# Patient Record
Sex: Male | Born: 1955
Health system: Southern US, Community
[De-identification: ages and names within clinical notes are randomized; demographics above are authoritative.]

## PROBLEM LIST (undated history)

## (undated) DIAGNOSIS — E785 Hyperlipidemia, unspecified: Secondary | ICD-10-CM

## (undated) DIAGNOSIS — E876 Hypokalemia: Secondary | ICD-10-CM

## (undated) DIAGNOSIS — J309 Allergic rhinitis, unspecified: Secondary | ICD-10-CM

## (undated) DIAGNOSIS — K625 Hemorrhage of anus and rectum: Secondary | ICD-10-CM

## (undated) DIAGNOSIS — E269 Hyperaldosteronism, unspecified: Secondary | ICD-10-CM

## (undated) DIAGNOSIS — R739 Hyperglycemia, unspecified: Secondary | ICD-10-CM

## (undated) DIAGNOSIS — I10 Essential (primary) hypertension: Secondary | ICD-10-CM

## (undated) DIAGNOSIS — E041 Nontoxic single thyroid nodule: Secondary | ICD-10-CM

## (undated) HISTORY — DX: Essential (primary) hypertension: I10

## (undated) HISTORY — DX: Allergic rhinitis, unspecified: J30.9

## (undated) HISTORY — DX: Hyperaldosteronism, unspecified: E26.9

## (undated) HISTORY — DX: Hyperlipidemia, unspecified: E78.5

---

## 1999-02-15 DIAGNOSIS — K625 Hemorrhage of anus and rectum: Secondary | ICD-10-CM

## 1999-02-15 HISTORY — DX: Hemorrhage of anus and rectum: K62.5

## 1999-02-15 HISTORY — PX: ESOPHAGOGASTRODUODENOSCOPY: SHX1529

## 1999-02-15 HISTORY — PX: COLONOSCOPY: SHX174

## 1999-02-15 HISTORY — PX: SMALL BOWEL ENTEROSCOPY: SHX2415

## 1999-09-24 ENCOUNTER — Ambulatory Visit (HOSPITAL_COMMUNITY): Admission: RE | Admit: 1999-09-24 | Discharge: 1999-09-24 | Payer: Self-pay | Admitting: *Deleted

## 1999-09-24 ENCOUNTER — Encounter (INDEPENDENT_AMBULATORY_CARE_PROVIDER_SITE_OTHER): Payer: Self-pay | Admitting: Specialist

## 1999-12-10 ENCOUNTER — Encounter (INDEPENDENT_AMBULATORY_CARE_PROVIDER_SITE_OTHER): Payer: Self-pay | Admitting: Specialist

## 1999-12-11 ENCOUNTER — Inpatient Hospital Stay (HOSPITAL_COMMUNITY): Admission: EM | Admit: 1999-12-11 | Discharge: 1999-12-21 | Payer: Self-pay | Admitting: Emergency Medicine

## 1999-12-13 ENCOUNTER — Encounter: Payer: Self-pay | Admitting: *Deleted

## 1999-12-15 ENCOUNTER — Encounter: Payer: Self-pay | Admitting: General Surgery

## 1999-12-16 ENCOUNTER — Encounter: Payer: Self-pay | Admitting: General Surgery

## 1999-12-16 ENCOUNTER — Encounter: Payer: Self-pay | Admitting: *Deleted

## 1999-12-17 ENCOUNTER — Encounter: Payer: Self-pay | Admitting: General Surgery

## 2002-12-09 ENCOUNTER — Encounter: Payer: Self-pay | Admitting: Family Medicine

## 2002-12-09 ENCOUNTER — Encounter: Admission: RE | Admit: 2002-12-09 | Discharge: 2002-12-09 | Payer: Self-pay | Admitting: Family Medicine

## 2002-12-16 ENCOUNTER — Encounter: Admission: RE | Admit: 2002-12-16 | Discharge: 2002-12-16 | Payer: Self-pay | Admitting: Family Medicine

## 2003-07-31 ENCOUNTER — Encounter: Admission: RE | Admit: 2003-07-31 | Discharge: 2003-07-31 | Payer: Self-pay | Admitting: Family Medicine

## 2003-10-03 ENCOUNTER — Encounter: Admission: RE | Admit: 2003-10-03 | Discharge: 2003-10-03 | Payer: Self-pay | Admitting: Family Medicine

## 2004-10-18 ENCOUNTER — Emergency Department (HOSPITAL_COMMUNITY): Admission: EM | Admit: 2004-10-18 | Discharge: 2004-10-19 | Payer: Self-pay | Admitting: Emergency Medicine

## 2004-11-16 ENCOUNTER — Encounter: Admission: RE | Admit: 2004-11-16 | Discharge: 2004-12-09 | Payer: Self-pay | Admitting: Family Medicine

## 2006-10-11 ENCOUNTER — Emergency Department (HOSPITAL_COMMUNITY): Admission: EM | Admit: 2006-10-11 | Discharge: 2006-10-11 | Payer: Self-pay | Admitting: Emergency Medicine

## 2008-12-03 ENCOUNTER — Emergency Department (HOSPITAL_COMMUNITY): Admission: EM | Admit: 2008-12-03 | Discharge: 2008-12-03 | Payer: Self-pay | Admitting: Emergency Medicine

## 2010-07-02 NOTE — H&P (Signed)
[facility]  Patient:    Larry [name], Larry [name]                         MRN: [id number] Adm. Date:  [id number] Attending:  Sabino Gasser CC:         Redmond Baseman, M.D.  [employer]   History and Physical  HISTORY OF PRESENT ILLNESS:  The patient is a very pleasant 55 year old gentleman who I have seen once before in my office and have, in fact, done colonoscopy and endoscopy in August 2001.  The patient presented to the emergency room with hypotension and passage of bright red and dark red per rectum starting at approximately 4:30 this afternoon.  The patient and his wife give a history of approximately six bloody bowel movements since that time.  The patient denies abdominal pain or vomiting.  He did have shrimp and wine for dinner just prior to the onset of his symptoms.  The patient states that he had been previously well since I had seen him last in August, when he underwent endoscopy and colonoscopy.  He had been referred originally by Dr. Modesto Charon because of rectal bleeding, and I will refer to those findings from August 10.  Colonoscopy showed some erythematous changes in the rectum, which appeared endoscopically to be artifactual and, in fact, biopsies proved to be normal.  He also had internal hemorrhoids and, otherwise, had a negative colonoscopic examination to the terminal ileum.  Endoscopy, however, showed some changes of esophagitis and ulcers of the antrum which, at that time, I felt were probably the cause of the patients GI blood loss.  Biopsy at that time showed H. pylori gastritis around the ulcer base, and Prevpac was prescribed.  However, unfortunately, the patient never filled this prescription because his insurance company refused payment for the Prevpac because of the cost of $302, and the patient did not follow up with me further or call me to indicate that he was not under treatment, and was doing reasonably well until he  presented today with the bleeding and hypotension. The patient denied abdominal pain or vomiting.  He denies NASAIDs of any type. His wife says he takes absolutely no medication except for his hypertensive medication, Metoprolol 50 mg q.d. recently.  The patient does not smoke.  He occasionally uses alcohol, but in moderate quantities.  He lives with his wife, and they have been under some stress building a house in the [place] area of [place].  PHYSICAL EXAMINATION:  VITAL SIGNS:  Blood pressure 110 systolic with pulse 100.  His blood pressure on admission was 70 systolic.  He has been subsequently given IV fluids.  GENERAL:  Alert, oriented, pleasant man who appears in no distress, but is in the Trendelenburg position in the emergency room.  HEENT:  Otherwise unremarkable.  NECK:  No bruits are heard.  Thyroid is not enlarged.  LUNGS:  Clear.  HEART:  Regular rhythm without murmurs, rubs, or gallops.  ABDOMEN:  Obese but nontender throughout.  RECTAL:  No masses.  A small amount of fresh, bright red blood is noted per rectum.  LABORATORY DATA:  Hematocrit 38.1, protime 13.2 seconds.  No other laboratory studies draws at this time.  IMPRESSION:  Probable upper gastrointestinal bleed, recurrent, probably from the antral ulcers that have been untreated since August 2001, see above for details.  PLAN:  Admit the patient for observation.  Will plan endoscopy early in the morning (it is  currently after midnight).  Will probably need to treat this patient with proton pump inhibitor and treatment directed towards H. pylori. The patient denies any allergies at this time. DD:  12/11/99 TD:  12/11/99 Job: [id number]

## 2010-07-02 NOTE — Procedures (Signed)
[facility]  Patient:    Larry [name], Larry [name]                         MRN: [id number] Proc. Date: 12/13/99 Adm. Date:  [id number] Attending:  Sabino Gasser CC:         Redmond Baseman, M.D.   Procedure Report  PROCEDURE:  Colonoscopy.  GASTROENTEROLOGIST:  Sabino Gasser, M.D.  INDICATIONS:  Acute GI bleed.  Mr. Bonfanti presented to the hospital with hypotension, GI bleed.  Hematocrit dropped from 37 to 31; therefore, despite having a previous colonoscopy, we elected to prep him for another colon.  He had stopped bleeding but with the prep, he started bleeding once again.  ANESTHESIA:  Demerol 100 mg, Versed 12 mg.  PROCEDURE IN DETAIL:  With the patient mildly sedated and in the left lateral decubitus position, the Olympus video colonoscope was inserted in the rectum and passed under direct vision to the cecum.  Bright red blood was seen throughout the colon.  No gross lesions appreciated because of the blood.  We reached the terminal ileum where blood was seen.  We advanced as far as I could pass the scope, and more bright red blood and clots were seen in the terminal ileum which otherwise appeared normal.  Photographs were taken.  From this point, the endoscope was withdrawn, taking circumferential views of the entire terminal ileum and colonic mucosa as visualized.  The patients vital signs and pulse oximetry remained stable.  The patient tolerated the procedure well with no apparent complications.  FINDINGS: Bright red blood emanating from the small bowel.  No gross lesions seen.  PLAN:  Emergency endoscopy to rule out upper GI cause. DD:  12/13/99 TD:  12/13/99 Job: [id number]

## 2010-07-02 NOTE — Op Note (Signed)
[facility]  Patient:    [name], Larry                         MRN: [id number] Proc. Date: 12/20/99 Adm. Date:  [id number] Attending:  Sabino Gasser                           Operative Report  PROCEDURE:  Enteroscopy.  GASTROENTEROLOGIST:  Sabino Gasser, M.D.  INDICATIONS: GI bleed.  ANESTHESIA:  Demerol 90 mg, Versed 10 mg.  PROCEDURE IN DETAIL:  With the patient mildly sedated and in the left lateral decubitus position, the Olympus video pediatric endoscope was inserted into the mouth, passed under direct vision through the esophagus, stomach, and as far down the intestine as I could pass it before I could no longer easily pass it which was quite distal into the intestinal tract.  From this point, the endoscope was then slowly withdrawn, taking circumferential views of the entire small-bowel mucosa visualized until it pulled back all the way back to the stomach.  All of the previously noted mucosa appeared normal until I reached the stomach where there was a mild degree of antritis photographed only.  The endoscope was placed in retroflexion, and hiatal hernia was visualized.  The endoscope was then straightened and withdrawn, taking circumferential views of the entire gastric and esophageal mucosa which appeared normal.  The patients vital signs and pulse oximetry remained stable.  The patient tolerated the procedure well without apparent complications.  FINDINGS:  Essentially negative examination other than a small hiatal hernia and antritis which has been viewed previously.  PLAN:  Advance diet.  Follow patient clinically.  Angiogram if patient rebleeds.  Consider discharge in the morning or angiogram if patient bleeds. DD:  12/20/99 TD:  12/20/99 Job: [id number]

## 2010-07-02 NOTE — Procedures (Signed)
[facility]  Patient:    Larry [name], Larry [name]                         MRN: [id number] Proc. Date: 09/24/99 Attending:  Sabino Gasser, M.D.                           Procedure Report  PROCEDURE:  Upper endoscopy with biopsy.  INDICATION FOR PROCEDURE:  Rectal bleeding.  ANESTHESIA:  Additional Demerol 20 mg, Versed 1 mg given intravenously in divided dose.  DESCRIPTION OF PROCEDURE:  With the patient mildly sedated in the left lateral decubitus position, the Olympus video endoscope was inserted in the mouth and passed under direct vision through the esophagus. The distal esophagus was approached and there is a question of 2 small areas of Barretts esophagus short segments photographed and biopsied. We entered into the stomach. The fundus and body appeared normal. The antrum showed ulceration of the prepyloric area. Folds emanating from the pyloric area showed changes of erosion and ulceration and edema. This was photographed and biopsied. We entered into the duodenal bulb and second portion of the duodenum both of which appeared normal and were photographed. From this point, the endoscope was slowly withdrawn taking circumferential views of the entire duodenal mucosa until the endoscope was then pulled back into the stomach, placed in retroflexion to view the stomach from below and a hiatal hernia was seen as evidenced by a complete wrap of the gastroesophageal junction around the endoscope. The endoscope was then straightened and withdrawn taking circumferential views of the remaining gastric and esophageal mucosa which otherwise appeared normal. The patients vital signs and pulse oximeter remained stable. The patient tolerated the procedure well and there were no apparent complications.  FINDINGS:  Hiatal hernia with question of a short segment Barretts esophagus above this biopsied. Also ulceration of the antrum in the prepyloric area which very well may be  the cause of the patients bleeding. Will await the biopsy report. Will have the patient call me for results and follow-up with me as an outpatient. DD:  09/24/99 TD:  09/25/99 Job: [id number]

## 2010-07-02 NOTE — Procedures (Signed)
[facility]  Patient:    Larry [name], Larry [name]                         MRN: [id number] Proc. Date: 12/11/99 Adm. Date:  [id number] Attending:  Sabino Gasser CC:         Redmond Baseman, M.D.   Procedure Report  NO DICTATION. DD:  12/11/99 TD:  12/11/99 Job: [id number]

## 2010-07-02 NOTE — Discharge Summary (Signed)
[facility]  Patient:    Larry [name], Larry [name]                         MRN: [id number] Adm. Date:  [id number] Disc. Date: [id number] Attending:  Sabino Gasser CC:         Redmond Baseman, M.D., [facility]  [place]. Derrell Lolling, M.D., [facility]   Discharge Summary  HISTORY:  Larry [name] is a very nice 55 year old gentleman who I had seen previously as an outpatient to evaluate for rectal bleeding.  In the past, he had unremarkable colonoscopy showing some mild irritation of the anorectal area and scattered diverticula, mostly of the left side of the colon, and he had an ulcer in the antrum which was H. pylori positive.  Of note, he tells me on admission that he never had that treated because his insurance company would not pay for the H. pylori treatment that I prescribed because of cost. Therefore, it was never addressed.  He came in and was noted to have a blood pressure in the emergency room of 70 systolic after having passed a number of red stools per rectum.  The patient denies abdominal pain, vomiting, NSAID use, but stated that he bleeding he had noticed was very similar to the bleeding he had had previously.  I will refer you to the history and physical of that date for further details.  Of note, the patient was admitted to the hospital, and the following morning an endoscopy was done which showed a similar ulcer in the antrum, as he had had previously.  On the first hospital day, no further bleeding was noted.  His hematocrit was 37.  However, on the second hospital day, the patient re-bled, and colonoscopy was then scheduled for the following morning on the 29th.  It was noted that he had bright red blood in the colon, extending actually proximally into the terminal ileum quite a distance.  No active bleeding was seen, but the blood was red, indicating freshness of bleed and no specific lesions were noted.  An endoscopy was  done to verify the lower GI source, and the endoscopy was unremarkable.  There was no evidence of acute bleeding.  No significant change in his endoscopy on admission.  Therefore, it was felt that this was probably a lower gastrointestinal bleed, and a bleeding scan was done.  The bleeding scan performed showed a possible bleeding site felt to be in the splenic flexure area of the colon, but the delay films not carried out; therefore, there was some question of whether this was in fact where the bleeding was coming from.  Therefore, the patient was re-prepped for a repeat colonoscopy which was done on the 30th.  At that time, there was absolutely no blood seen in the colon or the terminal ileum.  There were no AVMs, clots, or other bleeding lesions.  He did have, again, scattered diverticula seen throughout the colon from transverse colon distally specifically in the splenic flexure. There was no evidence of significant diverticulosis, although there were some small diverticula present.  The patient was seen in surgical consultation who will follow the patient with [place] and made recommendations to the management of the patient.  On the 31st, the patient continued to bleed requiring transfusion, and an arteriogram was scheduled.  However, it was not carried out until nearly 12 hours later due to other emergencies encountered in the radiology department, and the  arteriogram at that time was negative. Additionally though, the radiology department carried out a Meckel scan which was negative, and a CT scan was also done by radiology independently, and no pathological findings were noted.  The patient, despite this, continued to have bleeding per rectum requiring further transfusion, and enteroclysis was performed from the proximal jejunum, and this was a negative study.  The 2nd of November, the patients bleeding seemed to have stopped, and a repeat bleeding scan confirmed this.  The patient was  followed; a diet was advanced, but no further bleeding occurred.  An enteroscopy was done on December 20, 1999, which other than the antritis and mild erosions from the antrum, which had been seen previously, no further abnormal findings were noted. Specifically, no AVMs or small bowel lesions were noted.  Since the patient remained stable without further bleeding, it was elected not to proceed with heparin provocation arteriogram at this time since it had been four days since the patient had last bled.  At the time of discharge, the patient was stable, eating a regular diet with no bleeding noted.  He was instructed to avoid NSAIDs, and he would be started on iron to rebuild his blood count which was a hematocrit of approximately 27 at the time of discharge.  DISCHARGE MEDICATIONS: 1. Toprol 50, as opposed to previously 100 mg a day. 2. He was discharged on iron sulfate 300 mg 3 times a day 3. Prevpac to be taken for a 14 day course.  FOLLOW-UP:  I have asked him to see me as an outpatient, to take the remainder of this week off from work, and he may return to work Monday of next week. Additionally, it was decided that the next course of action would be an arteriogram if he should re-present with rectal bleeding, with or without heparin provocation as necessary, and he is in full agreement with this plan at this time.  And a note to that effect for him to present to the emergency room is given to him at this time.  CONDITION ON DISCHARGE:  Stable and improved condition on Toprol 50 mg, iron sulfate 300 mg, Prevpac for two weeks.  FOLLOW-UP:  With me as an outpatient and come to the emergency room for an arteriogram if bleeding should recur.  At the time of discharge, his hematocrit was 27.8 with hemoglobin 9.9.  His prothrombin time, PTT were normal.  His electrolytes, BUN, and creatinine were normal.  His stomach biopsies showed gastritis active with Helicobacter pylori present.   All told, he received eight units of blood during this hospitalization. DD:  12/21/99 TD:  12/22/99 Job: [id number]

## 2010-07-02 NOTE — Procedures (Signed)
[place]  Patient:    Larry [name], Larry [name]                         MRN: [id number] Adm. Date:  [id number] Attending:  Sabino Gasser                           Procedure Report  PROCEDURE:  Colonoscopy.  INDICATIONS:  Continued rectal bleeding.  ANESTHESIA:  Demerol 70 mg, Versed 5 mg.  DESCRIPTION OF PROCEDURE:  With the patient mildly sedated in the left lateral decubitus position, the Olympus videoscopic colonoscope was inserted in the rectum and found absolutely no blood at this time after he had been cleansed using a GoLYTELY prep.  We passed the colonoscope easily to the cecum.  Cecum identified by the ileocecal valve and appendiceal orifice.  We entered into the terminal ileum via the ileocecal valve to pass the colonoscope proximally as far as we could.  Again no blood and no bleeding sites seen.  The colonoscope was then slowly withdrawn, taking circumferential views of the small bowel and colonic mucosa, stopping specifically in the splenic flexure area, proximal to it in the transverse colon, and traversing this area a few times looking for AVMs or any other abnormality that might be potentially a bleeding site.  We did see some scattered diverticula in this area, and these were photographed, but certainly there was no evidence of bleeding at this time.  No AVMs were appreciated at this point and after careful evaluation of this area, the colonoscope was then further withdrawn all the way to the rectum, again which appeared normal on direct view and showed small internal hemorrhoids on retroflexed view.  The endoscope was straightened and withdrawn.  The patients vital signs, pulse oximetry remained stable.  The patient tolerated the procedure well without apparent complications.  FINDINGS:  Diverticula seen starting in the proximal transverse colon, rare there, and only mild to quite moderate amount seen in the splenic flexure area and beyond  distally.  No acute bleeding seen at this time.  PLAN:   Advance patients diet as tolerated and plan for arteriogram should patient re-bleed on this hospitalization or in the future. DD:  12/14/99 TD:  12/14/99 Job: [id number]

## 2010-07-02 NOTE — Op Note (Signed)
[facility]  Patient:    Larry [name], Larry [name]                         MRN: [id number] Proc. Date: 09/24/99 Adm. Date:  [id number] Disc. Date: [id number] Attending:  Sabino Gasser                           Operative Report  PROCEDURE PERFORMED:  Colonoscopy.  ENDOSCOPIST:  Sabino Gasser, M.D.  INDICATIONS:  Rectal bleeding.  ANESTHESIA:  Demerol 50 mg and Versed 6.5 mg were given intravenously in divided doses.  DESCRIPTION OF PROCEDURE:  With the patient mildly sedated in the left lateral decubitus position, the Olympus videoscopic colonoscope was inserted into the rectum after rectal exam was done and no abnormalities were noted. Subsequently, the Olympus videoscopic colonoscope was inserted and passed under direct vision to the cecum.  The cecum identified by ileocecal valve and appendiceal orifice.  We entered into the terminal ileum via the ileocecal valve and this too appeared normal.  It was photographed.  From this point, the colonoscope was slowly withdrawn taking circumferential views of the entire colonic mucosa stopping only to photograph areas of speckled erythema seen which may be just related to the prep, but a biopsy and photograph was taken.  We pulled back to the rectum which appeared normal in direct view and showed small internal hemorrhoids on retroflex view.  The endoscope was straightened and withdrawn.  The patients vital signs and pulse oximeter remained stable.  The patient tolerated the procedure well without apparent complications.  FINDINGS:  Erythema of the rectosigmoid area which made the artifactual biopsy and internal hemorrhoid, otherwise unremarkable examination including the terminal ileum.  PLAN:  Proceed to upper endoscopy. DD:  09/24/99 TD:  09/25/99 Job: [id number]

## 2010-07-02 NOTE — Procedures (Signed)
[facility]  Patient:    Larry [name], Larry [name]                         MRN: [id number] Proc. Date: 12/11/99 Adm. Date:  [id number] Attending:  Sabino Gasser                           Procedure Report  PROCEDURE:  Upper endoscopy with biopsy.  SURGEON:  Sabino Gasser, M.D.  INDICATIONS:  GI bleed and hypotension.  ANESTHESIA:  Demerol 80 and Versed 8 mg.  DESCRIPTION OF PROCEDURE:  With the patient mildly sedated in the left lateral decubitus position, the Olympus videoscopic endoscope was inserted in the mouth and passed under direct vision through the esophagus which appeared normal.  There was no evidence of Barretts esophagus or esophagitis.  We entered into the stomach.  The fundus, body, and antrum reviewed.  The antrum appeared mildly edematous after we washed this area.  We saw no acute inflammatory changes at this time, but we biopsied one of the thickened antral folds.  We advanced into the duodenum and second portion of the duodenum which appeared mildly edematous, but again, clearly inflamed.  From this point, the endoscope was slowly withdrawn, taking circumferential views of the entire duodenal mucosa until the endoscope had been pulled back into the stomach, at which point, we once again photographed the antrum and placed the endoscope in retroflexion to view the stomach from below and this appeared grossly normal except for a possibly thickened fold in the fundus which was captured and photographed.  The endoscope was then straightened and pulled back from distal to proximal stomach, taking circumferential views of the entire gastric mucosa.  An enlargement of one of the antral folds was seen and appeared to be normal mucosally, and there is probably submucosal myoma or lipoma, fairly small in actuality when the stomach was inflated.  The endoscoped was pulled back to the fundus where the previously noted thickened fold was biopsied.  The endoscope  was then withdrawn taking circumferential views of the remaining gastric and esophageal mucosa which otherwise appeared normal.  The patients vital signs and pulse oximeter remained stable.  The patient tolerated the procedure well and without apparent complications.  FINDINGS:  No evidence of acute bleeding from the stomach or upper intestinal tract at this time.  Biopsies taken and await biopsy report.  The patient will be advanced on his diet and continue on proton pump inhibitor if present. Will treat H. pylori at this time.  Further studies to be determined depending on the patients clinical course. DD:  12/11/99 TD:  12/11/99 Job: [id number]

## 2010-07-02 NOTE — Procedures (Signed)
[facility]  Patient:    Larry [name], Larry [name]                         MRN: [id number] Proc. Date: 09/24/99 Adm. Date:  [id number] Disc. Date: [id number] Attending:  Sabino Gasser                           Procedure Report  ADDENDUM:  Please send cc to Dr. Manuela Schwartz. DD:  09/24/99 TD:  09/25/99 Job: [id number]

## 2010-07-02 NOTE — Procedures (Signed)
[facility]  Patient:    Larry [name], Larry [name]                         MRN: [id number] Attending:  Sabino Gasser, M.D.                           Procedure Report  PROCEDURE:  Endoscopy.  INDICATIONS:  GI bleed.  No consent had been obtained, but this was done on an emergent basis.  ANESTHESIA:  None further given.  DESCRIPTION OF PROCEDURE:  With the patient mildly sedated in the left lateral decubitus position, the Olympus videoscopic endoscope inserted in the mouth, advanced under direct vision through the esophagus, which appeared normal, and into the stomach.  The distal antrum was once again seen.  It seemed to be inflamed, as we had noted previously, but there was no bleeding from this site.  We passed the endoscope into the duodenal bulb and second portion of the duodenum.  Bile was seen but no bleeding seen.  The endoscope was then withdrawn.  The patients vital signs and pulse oximetry remained stable.  The patient tolerated the procedure well with no apparent complication.  FINDINGS:  No evidence of bleeding from the stomach or second portion of the duodenum.  IMPRESSION:  Bleeding probably from small bowel.  PLAN:  See colonoscopy note for further details.  I have contacted Angelia Mould. Derrell Lolling, M.D., from surgery to evaluate the patient with me. DD:  12/13/99 TD:  12/13/99 Job: [id number]

## 2010-11-26 LAB — DIFFERENTIAL
Basophils Absolute: 0
Basophils Relative: 0
Eosinophils Absolute: 0.3
Eosinophils Relative: 5
Lymphocytes Relative: 29
Lymphs Abs: 1.6
Monocytes Absolute: 0.5
Monocytes Relative: 9
Neutro Abs: 3.1
Neutrophils Relative %: 57

## 2010-11-26 LAB — POCT CARDIAC MARKERS
CKMB, poc: 2.1
CKMB, poc: 2.6
Myoglobin, poc: 110
Myoglobin, poc: 113
Operator id: 4533
Operator id: 4533
Troponin i, poc: <0.05
Troponin i, poc: <0.05

## 2010-11-26 LAB — CBC
HCT: 42.3
Hemoglobin: 14.3
MCHC: 33.8
MCV: 87.5
Platelets: 172
RBC: 4.83
RDW: 15 — ABNORMAL HIGH
WBC: 5.6

## 2010-11-26 LAB — BASIC METABOLIC PANEL
BUN: 9
CO2: 28
Calcium: 8.8
Chloride: 102
Creatinine, Ser: 0.75
GFR calc Af Amer: >60
GFR calc non Af Amer: >60
Glucose, Bld: 101 — ABNORMAL HIGH
Potassium: 3 — ABNORMAL LOW
Sodium: 140

## 2010-11-26 LAB — D-DIMER, QUANTITATIVE: D-Dimer, Quant: <0.22

## 2013-05-06 ENCOUNTER — Emergency Department (HOSPITAL_COMMUNITY)
Admission: EM | Admit: 2013-05-06 | Discharge: 2013-05-06 | Disposition: A | Discharge status: Home / Self Care | Payer: 59

## 2013-05-06 NOTE — ED Notes (Signed)
Patient did not answer when called 

## 2013-05-06 NOTE — ED Notes (Signed)
Pt did not respond when called. 

## 2013-12-22 ENCOUNTER — Encounter: Payer: Self-pay | Admitting: *Deleted

## 2014-01-14 DIAGNOSIS — E041 Nontoxic single thyroid nodule: Secondary | ICD-10-CM

## 2014-01-14 DIAGNOSIS — R739 Hyperglycemia, unspecified: Secondary | ICD-10-CM

## 2014-01-14 HISTORY — DX: Hyperglycemia, unspecified: R73.9

## 2014-01-14 HISTORY — DX: Nontoxic single thyroid nodule: E04.1

## 2014-01-14 HISTORY — PX: NM MYOVIEW LTD: HXRAD82

## 2014-01-28 ENCOUNTER — Observation Stay (HOSPITAL_COMMUNITY)
Admission: EM | Admit: 2014-01-28 | Discharge: 2014-01-30 | Disposition: A | Discharge status: Home / Self Care | Payer: 59 | Attending: Cardiology | Admitting: Cardiology

## 2014-01-28 ENCOUNTER — Encounter (HOSPITAL_COMMUNITY): Payer: Self-pay | Admitting: Emergency Medicine

## 2014-01-28 ENCOUNTER — Emergency Department (HOSPITAL_COMMUNITY): Payer: 59

## 2014-01-28 DIAGNOSIS — I152 Hypertension secondary to endocrine disorders: Secondary | ICD-10-CM | POA: Diagnosis present

## 2014-01-28 DIAGNOSIS — R079 Chest pain, unspecified: Secondary | ICD-10-CM | POA: Diagnosis present

## 2014-01-28 DIAGNOSIS — R110 Nausea: Secondary | ICD-10-CM | POA: Diagnosis not present

## 2014-01-28 DIAGNOSIS — E876 Hypokalemia: Secondary | ICD-10-CM | POA: Diagnosis present

## 2014-01-28 DIAGNOSIS — E669 Obesity, unspecified: Secondary | ICD-10-CM | POA: Diagnosis not present

## 2014-01-28 DIAGNOSIS — E041 Nontoxic single thyroid nodule: Secondary | ICD-10-CM | POA: Diagnosis present

## 2014-01-28 DIAGNOSIS — I200 Unstable angina: Secondary | ICD-10-CM | POA: Diagnosis present

## 2014-01-28 DIAGNOSIS — I472 Ventricular tachycardia, unspecified: Secondary | ICD-10-CM

## 2014-01-28 DIAGNOSIS — R931 Abnormal findings on diagnostic imaging of heart and coronary circulation: Secondary | ICD-10-CM | POA: Diagnosis not present

## 2014-01-28 DIAGNOSIS — E785 Hyperlipidemia, unspecified: Secondary | ICD-10-CM | POA: Diagnosis not present

## 2014-01-28 DIAGNOSIS — Z79899 Other long term (current) drug therapy: Secondary | ICD-10-CM | POA: Insufficient documentation

## 2014-01-28 DIAGNOSIS — Z720 Tobacco use: Secondary | ICD-10-CM | POA: Diagnosis not present

## 2014-01-28 DIAGNOSIS — I10 Essential (primary) hypertension: Secondary | ICD-10-CM | POA: Diagnosis not present

## 2014-01-28 DIAGNOSIS — R739 Hyperglycemia, unspecified: Secondary | ICD-10-CM | POA: Diagnosis present

## 2014-01-28 HISTORY — DX: Hyperglycemia, unspecified: R73.9

## 2014-01-28 HISTORY — DX: Hemorrhage of anus and rectum: K62.5

## 2014-01-28 HISTORY — DX: Hypokalemia: E87.6

## 2014-01-28 HISTORY — DX: Nontoxic single thyroid nodule: E04.1

## 2014-01-28 LAB — TSH: TSH: 0.957 u[IU]/mL (ref 0.350–4.500)

## 2014-01-28 LAB — BASIC METABOLIC PANEL
ANION GAP: 14 (ref 5–15)
BUN: 15 mg/dL (ref 6–23)
CHLORIDE: 101 meq/L (ref 96–112)
CO2: 30 mEq/L (ref 19–32)
Calcium: 9.3 mg/dL (ref 8.4–10.5)
Creatinine, Ser: 1.04 mg/dL (ref 0.50–1.35)
GFR calc Af Amer: >90 mL/min (ref >90)
GFR calc non Af Amer: 78 mL/min — ABNORMAL LOW (ref >90)
Glucose, Bld: 124 mg/dL — ABNORMAL HIGH (ref 70–99)
Potassium: 3 mEq/L — ABNORMAL LOW (ref 3.7–5.3)
SODIUM: 145 meq/L (ref 137–147)

## 2014-01-28 LAB — CBC
HCT: 44.8 % (ref 39.0–52.0)
Hemoglobin: 15.3 g/dL (ref 13.0–17.0)
MCH: 30 pg (ref 26.0–34.0)
MCHC: 34.2 g/dL (ref 30.0–36.0)
MCV: 87.8 fL (ref 78.0–100.0)
PLATELETS: 206 10*3/uL (ref 150–400)
RBC: 5.1 MIL/uL (ref 4.22–5.81)
RDW: 14.3 % (ref 11.5–15.5)
WBC: 7.3 10*3/uL (ref 4.0–10.5)

## 2014-01-28 LAB — I-STAT TROPONIN, ED: TROPONIN I, POC: 0 ng/mL (ref 0.00–0.08)

## 2014-01-28 LAB — PROTIME-INR
INR: 0.85 (ref 0.00–1.49)
PROTHROMBIN TIME: 11.7 s (ref 11.6–15.2)

## 2014-01-28 LAB — PRO B NATRIURETIC PEPTIDE: PRO B NATRI PEPTIDE: 114.2 pg/mL (ref 0–125)

## 2014-01-28 LAB — TROPONIN I: Troponin I: <0.3 ng/mL (ref <0.30)

## 2014-01-28 LAB — APTT: aPTT: 84 seconds — ABNORMAL HIGH (ref 24–37)

## 2014-01-28 LAB — MAGNESIUM: Magnesium: 2.1 mg/dL (ref 1.5–2.5)

## 2014-01-28 MED ORDER — NITROGLYCERIN 0.4 MG SL SUBL: 0.4000 mg | SUBLINGUAL_TABLET | SUBLINGUAL | Status: DC | PRN

## 2014-01-28 MED ORDER — ALPRAZOLAM 0.25 MG PO TABS: 0.2500 mg | ORAL_TABLET | Freq: Two times a day (BID) | ORAL | Status: DC | PRN

## 2014-01-28 MED ORDER — ACETAMINOPHEN 325 MG PO TABS: 650.0000 mg | ORAL_TABLET | ORAL | Status: DC | PRN

## 2014-01-28 MED ORDER — ZOLPIDEM TARTRATE 5 MG PO TABS: 5.0000 mg | ORAL_TABLET | Freq: Every evening | ORAL | Status: DC | PRN

## 2014-01-28 MED ORDER — SODIUM CHLORIDE 0.9 % IJ SOLN
3.0000 mL | Freq: Two times a day (BID) | INTRAMUSCULAR | Status: DC
  Administered 2014-01-28 – 2014-01-30 (×3): 3 mL via INTRAVENOUS

## 2014-01-28 MED ORDER — ATORVASTATIN CALCIUM 40 MG PO TABS
40.0000 mg | ORAL_TABLET | Freq: Every day | ORAL | Status: DC
  Administered 2014-01-29 – 2014-01-30 (×2): 40 mg via ORAL
  Filled 2014-01-28 (×2): qty 1

## 2014-01-28 MED ORDER — AMLODIPINE BESYLATE 5 MG PO TABS
5.0000 mg | ORAL_TABLET | Freq: Every day | ORAL | Status: DC
  Administered 2014-01-28: 5 mg via ORAL
  Filled 2014-01-28: qty 1

## 2014-01-28 MED ORDER — ASPIRIN 325 MG PO TABS
325.0000 mg | ORAL_TABLET | Freq: Once | ORAL | Status: AC
Start: 2014-01-28 — End: 2014-01-28
  Administered 2014-01-28: 325 mg via ORAL
  Filled 2014-01-28: qty 1

## 2014-01-28 MED ORDER — POTASSIUM CHLORIDE CRYS ER 10 MEQ PO TBCR
10.0000 meq | EXTENDED_RELEASE_TABLET | Freq: Two times a day (BID) | ORAL | Status: DC
  Administered 2014-01-28: 10 meq via ORAL
  Filled 2014-01-28: qty 1

## 2014-01-28 MED ORDER — PNEUMOCOCCAL VAC POLYVALENT 25 MCG/0.5ML IJ INJ
0.5000 mL | INJECTION | INTRAMUSCULAR | Status: DC
  Filled 2014-01-28: qty 0.5

## 2014-01-28 MED ORDER — SODIUM CHLORIDE 0.9 % IJ SOLN: 3.0000 mL | INTRAMUSCULAR | Status: DC | PRN

## 2014-01-28 MED ORDER — METOPROLOL SUCCINATE ER 50 MG PO TB24
50.0000 mg | ORAL_TABLET | Freq: Every day | ORAL | Status: DC
  Administered 2014-01-29 – 2014-01-30 (×2): 50 mg via ORAL
  Filled 2014-01-28 (×2): qty 1

## 2014-01-28 MED ORDER — HEPARIN BOLUS VIA INFUSION
4000.0000 [IU] | Freq: Once | INTRAVENOUS | Status: AC
  Administered 2014-01-28: 4000 [IU] via INTRAVENOUS
  Filled 2014-01-28: qty 4000

## 2014-01-28 MED ORDER — SODIUM CHLORIDE 0.9 % IV SOLN: 250.0000 mL | INTRAVENOUS | Status: DC | PRN

## 2014-01-28 MED ORDER — HEPARIN (PORCINE) IN NACL 100-0.45 UNIT/ML-% IJ SOLN
1900.0000 [IU]/h | INTRAMUSCULAR | Status: DC
  Administered 2014-01-28: 1350 [IU]/h via INTRAVENOUS
  Administered 2014-01-29: 1700 [IU]/h via INTRAVENOUS
  Filled 2014-01-28 (×2): qty 250

## 2014-01-28 MED ORDER — POTASSIUM CHLORIDE CRYS ER 20 MEQ PO TBCR
40.0000 meq | EXTENDED_RELEASE_TABLET | Freq: Once | ORAL | Status: AC
Start: 2014-01-28 — End: 2014-01-28
  Administered 2014-01-28: 40 meq via ORAL
  Filled 2014-01-28: qty 2

## 2014-01-28 MED ORDER — ONDANSETRON HCL 4 MG/2ML IJ SOLN: 4.0000 mg | Freq: Four times a day (QID) | INTRAMUSCULAR | Status: DC | PRN

## 2014-01-28 MED ORDER — LABETALOL HCL 5 MG/ML IV SOLN
10.0000 mg | INTRAVENOUS | Status: DC | PRN
  Administered 2014-01-28: 10 mg via INTRAVENOUS
  Filled 2014-01-28: qty 4

## 2014-01-28 NOTE — Consult Note (Signed)
CARDIOLOGY CONSULT NOTE   Patient ID: Larry [name] MRN: [id number] DOB/AGE: 57-Oct-1957 57 y.o.  Admit date: 01/28/2014  Primary Physician   No primary care provider on file. Primary Cardiologist   New Reason for Consultation   Chest pain  GEX:[id number]:Larry [name] is a 58 y.o. male with no history of CAD.  He may have had a stress test or echocardiogram in the past by Dr. Algie CofferKadakia, but no records are available.   He reports nausea intermittently for 3 days. No vomiting, no blood in stools or urine. No SOB, chronic DOE, no recent change. He snores heavily. Has a little trouble swallowing in the morning in the past, not now.   Today he was at work, not too strenuous, when he had onset of upper left chest pain. It will ache and hurt. No association with exertion, no change with movement, position, palpation or deep inspiration. Never had it before. Reached a 5/10. No associated symptoms. He has had > 10 episodes since this am, but they resolve without intervention. They concerned him, so he came to the ER. In the ER, he does not appear to be acutely uncomfortable.    Past Medical History  Diagnosis Date  . Hypertension   . AR (allergic rhinitis)   . Hyperlipidemia   . Rectal bleed 2001     Past Surgical History  Procedure Laterality Date  . Esophagogastroduodenoscopy  2001  . Colonoscopy  2001  . Small bowel enteroscopy  2001   No Known Allergies  I have reviewed the patient's current medications . potassium chloride  40 mEq Oral Once   Prior to Admission medications   Medication Sig Start Date End Date Taking? Authorizing Provider  amLODipine (NORVASC) 5 MG tablet Take 5 mg by mouth daily.    Historical Provider, MD  metoprolol succinate (TOPROL-XL) 25 MG 24 hr tablet Take 25 mg by mouth daily.    Historical Provider, MD  potassium chloride (K-DUR,KLOR-CON) 10 MEQ tablet Take 10 mEq by mouth 2 (two) times daily.    Historical Provider, MD  simvastatin (ZOCOR) 10 MG tablet  Take 10 mg by mouth daily.    Historical Provider, MD     History   Social History  . Marital Status: Married    Spouse Name: N/A    Number of Children: N/A  . Years of Education: N/A   Occupational History  . [employer]    Social History Main Topics  . Smoking status: Current Every Day Smoker  . Smokeless tobacco: Not on file  . Alcohol Use: No  . Drug Use: No  . Sexual Activity: Not on file   Other Topics Concern  . Not on file   Social History Narrative   Lives with wife    Family Status  Relation Status Death Age  . Mother Deceased     Hx CAD  . Father Deceased 5179    Hx CAD in his 5360s  . Sister Alive    Family History  Problem Relation Age of Onset  . Cancer Mother   . Heart failure Mother   . Diabetes Mother   . Hypertension Father   . Hyperlipidemia Father   . Heart attack Father   . Sleep apnea Sister      ROS: Occasional leg numbness when sitting on toilet. Full 14 point review of systems complete and found to be negative unless listed above.  Physical Exam: Blood pressure 132/93, pulse 88, temperature 98.1  F (36.7 C), temperature source Oral, resp. rate 22, SpO2 95 %.  General: Well developed, well nourished, male in no acute distress Head: Eyes PERRLA, No xanthomas.   Normocephalic and atraumatic, oropharynx without edema or exudate. Dentition: good Lungs: slight exp wheeze, few rales Heart: HRRR S1 S2, no rub/gallop, no murmur. pulses are 2+ all 4 extrem.   Neck: No carotid bruits. No lymphadenopathy.  JVD not elevated. Abdomen: Bowel sounds present, abdomen soft and non-tender without masses or hernias noted. Msk:  No spine or cva tenderness. No weakness, no joint deformities or effusions. Extremities: No clubbing or cyanosis. No edema.  Neuro: Alert and oriented X 3. No focal deficits noted. Psych:  Good affect, responds appropriately Skin: No rashes or lesions noted.  Labs:   Lab Results  Component Value Date   WBC 7.3 01/28/2014    HGB 15.3 01/28/2014   HCT 44.8 01/28/2014   MCV 87.8 01/28/2014   PLT 206 01/28/2014    Recent Labs Lab 01/28/14 1444  NA 145  K 3.0*  CL 101  CO2 30  BUN 15  CREATININE 1.04  CALCIUM 9.3  GLUCOSE 124*    Recent Labs  01/28/14 1511  TROPIPOC 0.00   ECG:  SR, nonspecific T wave abnormality  Radiology:  Dg Chest 2 View  01/28/2014   CLINICAL DATA:  Chest pain since 9 a.m. Dizziness. Smoker 1 pack per day.  EXAM: CHEST  2 VIEW  COMPARISON:  10/11/2006  FINDINGS: The heart size and mediastinal contours are within normal limits. Both lungs are clear. The visualized skeletal structures are unremarkable.  IMPRESSION: No active cardiopulmonary disease.   Electronically Signed   By: Elige Ko   On: 01/28/2014 16:11    ASSESSMENT AND PLAN:   The patient was seen today by Dr. Herbie Baltimore, the patient evaluated and the data reviewed.  Principal Problem:   Chest pain with moderate risk for cardiac etiology - consider admit, r/o MI, ck echo, cath vs MV per MD. Will add heparin.  Active Problems:   Hypertension - initial BP 179/82, continue meds but increase BB, follow    Hyperlipidemia - ck profile and LFTs, continue rx    VT (ventricular tachycardia) - pt felt this and has it regularly, but no history of presyncope or syncope.    Financial issues - case Production designer, theatre/television/film and financial assistance to see.  SignedTheodore Demark, PA-C 01/28/2014 5:49 PM Beeper [phone]  Co-Sign MD  I saw examined the patient along with Larry Demark, PA-C in the emergency room today. The patient has a history of hypertension, hyperlipidemia, smoking and a family history of CAD but at older ages. Pre-profound hypertension.  He has had a stress test in the past several years ago. He was told he had an enlarged heart.  He presented emergency room today with symptoms that are relatively atypical sounding with left upper chest discomfort waxing and waning lasting a minute or 2 each occasion. Not associated  with dyspnea or particular with rest or exertion. He does have some mild inferior lead ST segment depressions on EKG that is probably related to LVH but cannot be certain. I don't have an old EKG to compare to.  His symptoms sound relatively atypical, and his initial cardiac enzymes were negative.  My initially conation was to allow him to be discharged with an outpatient stress test, however while we were discussing this potential option with him, he had a 10-15 beat run of wide complex tachycardia concerning for ventricular  tachycardia. He notes that he is felt this type of palpitation episode intermittently for about a year now.  With this new finding, I think is relatively pertinent that we evaluate for potential ischemic etiology as an inpatient just to be sure. I would increase the beta blocker as he continues to be hypertensive. Would also consider echocardiogram to evaluate for any potential structural abnormalities. Until enzymes prove negative I agree with heparinization.  Larry LexHARDING,Larry [name], M.D., M.S. Interventional Cardiologist   Pager # [phone]

## 2014-01-28 NOTE — ED Notes (Signed)
Cardiology at bedside.

## 2014-01-28 NOTE — ED Notes (Signed)
Attempted to call report

## 2014-01-28 NOTE — ED Notes (Signed)
Delorise Jacksonori, RN at bedside

## 2014-01-28 NOTE — Progress Notes (Signed)
ANTICOAGULATION CONSULT NOTE - Initial Consult  Pharmacy Consult for Heparin Indication: chest pain/ACS  No Known Allergies  Patient Measurements: Height: 5\' 10"  (177.8 cm) Weight: 248 lb 11.2 oz (112.81 kg) IBW/kg (Calculated) : 73 Heparin Dosing Weight: 97.7 kg  Vital Signs: Temp: 98.1 F (36.7 C) (12/15 1438) Temp Source: Oral (12/15 1438) BP: 157/122 mmHg (12/15 1939) Pulse Rate: 83 (12/15 1939)  Labs:  Recent Labs  01/28/14 1444 01/28/14 1822  HGB 15.3  --   HCT 44.8  --   PLT 206  --   LABPROT  --  11.7  INR  --  0.85  CREATININE 1.04  --   TROPONINI  --  <0.30    CrCl cannot be calculated (Unknown ideal weight.).   Medical History: Past Medical History  Diagnosis Date  . Hypertension   . AR (allergic rhinitis)   . Hyperlipidemia   . Rectal bleed 2001    Medications:  Prescriptions prior to admission  Medication Sig Dispense Refill Last Dose  . amLODipine (NORVASC) 5 MG tablet Take 5 mg by mouth daily.   01/28/2014 at Unknown time  . aspirin 81 MG tablet Take 81 mg by mouth daily.   01/28/2014 at Unknown time  . metoprolol succinate (TOPROL-XL) 25 MG 24 hr tablet Take 25 mg by mouth daily.   01/28/2014 at 0630  . potassium chloride (K-DUR,KLOR-CON) 10 MEQ tablet Take 10 mEq by mouth 2 (two) times daily.   01/28/2014 at Unknown time  . simvastatin (ZOCOR) 10 MG tablet Take 10 mg by mouth daily.   01/28/2014 at Unknown time    Assessment: 58 year old male with history of hypertension, hyperlipidemia, and smoking admitted with ACS and 3 day history of nausea to start IV heparin. Patient was not on anticoagulation prior to admission.   Anticoagulation: Baseline INR 0.85. CBC is within normal limits. No bleeding noted. Initial troponin was negative. SCr within normal limits.   Goal of Therapy:  Heparin level 0.3-0.7 units/ml Monitor platelets by anticoagulation protocol: Yes   Plan:  Start heparin bolus of 4000 units followed by heparin drip at 1350  units/hr.  Heparin level in 6 hours  Daily heparin level and CBC while on therapy  Link SnufferJessica Travion Ke, PharmD, BCPS Clinical Pharmacist [phone] 01/28/2014,8:19 PM

## 2014-01-28 NOTE — ED Notes (Signed)
Pt initially reported he had taken all his medications this morning; pt now reports he did not take Norvasc because he has been out of medication.

## 2014-01-28 NOTE — ED Provider Notes (Signed)
CSN: [id number]     Arrival date & time 01/28/14  1428 History   First MD Initiated Contact with Patient 01/28/14 1510     Chief Complaint  Patient presents with  . Chest Pain     (Consider location/radiation/quality/duration/timing/severity/associated sxs/prior Treatment) HPI  Larry [name] is a 58 y.o. male with PMH of hypertension, hyperlipidemia, obesity, smoking presenting with dental left-sided intermittent chest pain that started at 9 AM this morning. Patient states the pain only last for a minute or so and then it resolves. He states he has had the chest pain every 30-45 minutes. Patient states he is doing activity one the chest pain happens. It is not worse with deep breathing, eating. He also notes some shortness of breath and diaphoresis when the pain occurs. He also notes some nausea for the past 3 days. He denies having a history of chest pain. He was seen by a cardiologist for years ago but denies any stent placement for MI. Patient has not taken anything for his pain. Patient also notes increased stress at work and at home. He is concerned this is related. A shunt denies history of DVT, PE, recent surgery, trauma, unilateral leg swelling or pain.  Past Medical History  Diagnosis Date  . Hypertension   . AR (allergic rhinitis)   . Hyperlipidemia    History reviewed. No pertinent past surgical history. Family History  Problem Relation Age of Onset  . Cancer Mother   . Heart failure Mother   . Diabetes Mother   . Hypertension Father   . Hyperlipidemia Father   . Heart attack Father   . Sleep apnea Sister    History  Substance Use Topics  . Smoking status: Current Every Day Smoker  . Smokeless tobacco: Not on file  . Alcohol Use: No    Review of Systems  Constitutional: Negative for fever and chills.  HENT: Negative for congestion and rhinorrhea.   Eyes: Negative for visual disturbance.  Respiratory: Positive for shortness of breath. Negative for cough.    Cardiovascular: Positive for chest pain. Negative for palpitations.  Gastrointestinal: Positive for nausea. Negative for vomiting and diarrhea.  Genitourinary: Negative for dysuria and hematuria.  Musculoskeletal: Negative for back pain and gait problem.  Skin: Negative for rash.  Neurological: Negative for weakness and headaches.      Allergies  Review of patient's allergies indicates no known allergies.  Home Medications   Prior to Admission medications   Medication Sig Start Date End Date Taking? Authorizing Provider  amLODipine (NORVASC) 5 MG tablet Take 5 mg by mouth daily.    Historical Provider, MD  metoprolol succinate (TOPROL-XL) 25 MG 24 hr tablet Take 25 mg by mouth daily.    Historical Provider, MD  potassium chloride (K-DUR,KLOR-CON) 10 MEQ tablet Take 10 mEq by mouth 2 (two) times daily.    Historical Provider, MD  simvastatin (ZOCOR) 10 MG tablet Take 10 mg by mouth daily.    Historical Provider, MD   BP 165/93 mmHg  Pulse 94  Temp(Src) 98.1 F (36.7 C) (Oral)  Resp 22  SpO2 97% Physical Exam  Constitutional: He appears well-developed and well-nourished. No distress.  HENT:  Head: Normocephalic and atraumatic.  Eyes: Conjunctivae and EOM are normal. Right eye exhibits no discharge. Left eye exhibits no discharge.  Neck: No JVD present.  Cardiovascular: Normal rate, regular rhythm and normal heart sounds.   No leg swelling or tenderness. Negative Homan's sign.  Pulmonary/Chest: Effort normal and breath sounds normal.  No respiratory distress. He has no wheezes.  Abdominal: Soft. Bowel sounds are normal. He exhibits no distension. There is no tenderness.  Neurological: He is alert. He exhibits normal muscle tone. Coordination normal.  Skin: Skin is warm and dry. He is not diaphoretic.  Nursing note and vitals reviewed.   ED Course  Procedures (including critical care time) Labs Review Labs Reviewed  CBC  BASIC METABOLIC PANEL  I-STAT TROPOININ, ED     Imaging Review No results found.   EKG Interpretation None      MDM   Final diagnoses:  Chest pain   Patient without prior cardiac history presenting with intermittent 1 episodes of chest pain while at rest and with activity. Patient cannot identify exacerbating or alleviating factors. Patient with hypertension, hyperlipidemia, smoker, obesity, family history of MI. No acute distress heart RRR, lungs CTAB. No JVD or leg swelling. Negative troponin and chest x-ray. EKG with slight ST depression in inferior lateral leads. Concern for cardiac etiology of chest pain. Consult to cardiology and I spoke with Theodore Demarkhonda Barrett PA-C. Awaiting cardiology recommendations for treatment and disposition. Cardiology to admit to their service.  Discussed all results and patient verbalizes understanding and agrees with plan.  This is a shared patient. This patient was discussed with the physician, Dr. Blinda LeatherwoodPollina who saw and evaluated the patient and agrees with the plan.        [name], PA-C 01/28/14 1904  Gilda Creasehristopher J. Pollina, MD 01/28/14 [phone]

## 2014-01-28 NOTE — ED Notes (Signed)
Pt c/o L sided intermittent chest pain at 9am while at work. 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 Similar episode occurred about 5 minutes after. Increased shortness of breath and diaphoresis occured when pain began. Has had nausea for 3 days. Denies recent travel. Pt has been under more stress recently than usual. Tori, PA at bedside

## 2014-01-28 NOTE — ED Notes (Signed)
To ED via private vehicle with c/o chest pain that started this am at approx 10. Has been intermittent all day, drove self to the hospital.

## 2014-01-28 NOTE — ED Notes (Signed)
Admitting MD and PA are finished speaking with pt at this time; pt resting, watching tv; no needs at this time

## 2014-01-28 NOTE — ED Notes (Signed)
Spoke to [place] from cardiology regarding bp; orders to be placed

## 2014-01-28 NOTE — ED Provider Notes (Signed)
Patient presented to the ER with chest pain. Patient experiencing intermittent episodes of left-sided chest pain that lasted approximately a minute at a time. Pain does not necessarily occur with exertion, can occur at rest. He has not identified any cause or exacerbating factors. Pain generally resolves spontaneously after about a minute.  Face to face Exam: HEENT - PERRLA Lungs - CTAB Heart - RRR, no M/R/G Abd - S/NT/ND Neuro - alert, oriented x3  Plan: Patient with somewhat atypical presentation symptoms, however, EKG does show slight inferior and lateral ST depressions compared to previous EKGs. Patient does have multiple cardiac risk factors. Will consult cardiology to determine treatment course.   EKG Interpretation  Date/Time:  Tuesday January 28 2014 14:35:09 EST Ventricular Rate:  96 PR Interval:  158 QRS Duration: 86 QT Interval:  404 QTC Calculation: 510 R Axis:   -11 Text Interpretation:  Normal sinus rhythm Possible Left atrial enlargement Nonspecific ST abnormality Prolonged QT Abnormal ECG new ST depression inf-lat leads compared to prior Confirmed by [facility] Hospital[facility]LLINA  MD, CHRISTOPHER [phone]([id number]) on 01/28/2014 4:30:37 PM         Gilda Creasehristopher J. Pollina, MD 01/28/14 [phone]

## 2014-01-29 ENCOUNTER — Inpatient Hospital Stay (HOSPITAL_COMMUNITY): Payer: 59

## 2014-01-29 ENCOUNTER — Other Ambulatory Visit: Payer: Self-pay

## 2014-01-29 ENCOUNTER — Other Ambulatory Visit (HOSPITAL_COMMUNITY): Payer: 59

## 2014-01-29 DIAGNOSIS — I10 Essential (primary) hypertension: Secondary | ICD-10-CM | POA: Diagnosis not present

## 2014-01-29 DIAGNOSIS — R931 Abnormal findings on diagnostic imaging of heart and coronary circulation: Secondary | ICD-10-CM | POA: Diagnosis not present

## 2014-01-29 DIAGNOSIS — E876 Hypokalemia: Secondary | ICD-10-CM

## 2014-01-29 DIAGNOSIS — E785 Hyperlipidemia, unspecified: Secondary | ICD-10-CM | POA: Diagnosis not present

## 2014-01-29 DIAGNOSIS — R079 Chest pain, unspecified: Secondary | ICD-10-CM

## 2014-01-29 DIAGNOSIS — I517 Cardiomegaly: Secondary | ICD-10-CM

## 2014-01-29 LAB — COMPREHENSIVE METABOLIC PANEL
ALBUMIN: 3.3 g/dL — AB (ref 3.5–5.2)
ALT: 17 U/L (ref 0–53)
AST: 19 U/L (ref 0–37)
Alkaline Phosphatase: 78 U/L (ref 39–117)
Anion gap: 14 (ref 5–15)
BUN: 14 mg/dL (ref 6–23)
CO2: 27 mEq/L (ref 19–32)
Calcium: 8.3 mg/dL — ABNORMAL LOW (ref 8.4–10.5)
Chloride: 104 mEq/L (ref 96–112)
Creatinine, Ser: 0.88 mg/dL (ref 0.50–1.35)
GFR calc Af Amer: >90 mL/min (ref >90)
GFR calc non Af Amer: >90 mL/min (ref >90)
Glucose, Bld: 102 mg/dL — ABNORMAL HIGH (ref 70–99)
Potassium: 2.7 mEq/L — CL (ref 3.7–5.3)
Sodium: 145 mEq/L (ref 137–147)
Total Bilirubin: 0.4 mg/dL (ref 0.3–1.2)
Total Protein: 6.3 g/dL (ref 6.0–8.3)

## 2014-01-29 LAB — BASIC METABOLIC PANEL
ANION GAP: 12 (ref 5–15)
BUN: 12 mg/dL (ref 6–23)
CHLORIDE: 100 meq/L (ref 96–112)
CO2: 29 meq/L (ref 19–32)
CREATININE: 0.79 mg/dL (ref 0.50–1.35)
Calcium: 8.5 mg/dL (ref 8.4–10.5)
GFR calc Af Amer: >90 mL/min (ref >90)
GFR calc non Af Amer: >90 mL/min (ref >90)
Glucose, Bld: 164 mg/dL — ABNORMAL HIGH (ref 70–99)
Potassium: 3.1 mEq/L — ABNORMAL LOW (ref 3.7–5.3)
Sodium: 141 mEq/L (ref 137–147)

## 2014-01-29 LAB — CORTISOL-AM, BLOOD: Cortisol - AM: 3.1 ug/dL — ABNORMAL LOW (ref 4.3–22.4)

## 2014-01-29 LAB — HEPARIN LEVEL (UNFRACTIONATED)
HEPARIN UNFRACTIONATED: 0.19 [IU]/mL — AB (ref 0.30–0.70)
Heparin Unfractionated: 0.1 IU/mL — ABNORMAL LOW (ref 0.30–0.70)

## 2014-01-29 LAB — TROPONIN I
Troponin I: <0.3 ng/mL (ref <0.30)
Troponin I: <0.3 ng/mL (ref <0.30)

## 2014-01-29 MED ORDER — AMLODIPINE BESYLATE 10 MG PO TABS
10.0000 mg | ORAL_TABLET | Freq: Every day | ORAL | Status: DC
  Administered 2014-01-30: 10 mg via ORAL
  Filled 2014-01-29: qty 1

## 2014-01-29 MED ORDER — POTASSIUM CHLORIDE CRYS ER 20 MEQ PO TBCR
40.0000 meq | EXTENDED_RELEASE_TABLET | ORAL | Status: AC
  Administered 2014-01-29: 40 meq via ORAL
  Filled 2014-01-29: qty 4

## 2014-01-29 MED ORDER — POTASSIUM CHLORIDE CRYS ER 20 MEQ PO TBCR
40.0000 meq | EXTENDED_RELEASE_TABLET | Freq: Once | ORAL | Status: AC
  Administered 2014-01-29: 40 meq via ORAL
  Filled 2014-01-29: qty 2

## 2014-01-29 MED ORDER — REGADENOSON 0.4 MG/5ML IV SOLN
0.4000 mg | Freq: Once | INTRAVENOUS | Status: AC
  Administered 2014-01-29: 0.4 mg via INTRAVENOUS
  Filled 2014-01-29: qty 5

## 2014-01-29 MED ORDER — TECHNETIUM TC 99M SESTAMIBI GENERIC - CARDIOLITE
10.0000 | Freq: Once | INTRAVENOUS | Status: AC | PRN
  Administered 2014-01-29: 10 via INTRAVENOUS

## 2014-01-29 MED ORDER — AMLODIPINE BESYLATE 5 MG PO TABS
5.0000 mg | ORAL_TABLET | ORAL | Status: AC
Start: 2014-01-29 — End: 2014-01-29
  Administered 2014-01-29: 5 mg via ORAL
  Filled 2014-01-29: qty 1

## 2014-01-29 MED ORDER — HEPARIN BOLUS VIA INFUSION
2000.0000 [IU] | Freq: Once | INTRAVENOUS | Status: AC
  Administered 2014-01-29: 2000 [IU] via INTRAVENOUS
  Filled 2014-01-29: qty 2000

## 2014-01-29 MED ORDER — REGADENOSON 0.4 MG/5ML IV SOLN
INTRAVENOUS | Status: AC
  Administered 2014-01-29: 0.4 mg via INTRAVENOUS
  Filled 2014-01-29: qty 5

## 2014-01-29 MED ORDER — AMLODIPINE BESYLATE 5 MG PO TABS
5.0000 mg | ORAL_TABLET | Freq: Every day | ORAL | Status: DC
  Administered 2014-01-29: 5 mg via ORAL
  Filled 2014-01-29: qty 1

## 2014-01-29 MED ORDER — POTASSIUM CHLORIDE CRYS ER 10 MEQ PO TBCR
10.0000 meq | EXTENDED_RELEASE_TABLET | Freq: Two times a day (BID) | ORAL | Status: DC
  Administered 2014-01-29 – 2014-01-30 (×3): 10 meq via ORAL
  Filled 2014-01-29 (×3): qty 1

## 2014-01-29 MED ORDER — TECHNETIUM TC 99M SESTAMIBI GENERIC - CARDIOLITE
30.0000 | Freq: Once | INTRAVENOUS | Status: AC | PRN
  Administered 2014-01-29: 30 via INTRAVENOUS

## 2014-01-29 NOTE — Progress Notes (Signed)
Pt kept tonight for several reason, K+ still low, and BP elevated.  Also thyroid scan recommended biopsy, we have asked endo to see.  Repleting K+

## 2014-01-29 NOTE — Progress Notes (Signed)
CRITICAL VALUE ALERT  Critical value received:  Potassium 2.7  Date of notification:  01/29/2014  Time of notification:  0421  Critical value read back:  Yes  Nurse who received alert:  B. Quintella BatonBuckner RN  MD notified (1st page):  On call for Cardiology, Dr. Adolm JosephWhitlock  Time of first page:  [phone]  MD notified (2nd page):  Time of second page:  Responding MD:  Dr. Adolm JosephWhitlock  Time MD responded:  [phone]  Orders received and entered

## 2014-01-29 NOTE — H&P (Signed)
   H&P  Please see Consult Note signed by me on 12/15.   Marykay LexHARDING,Wilmer Berryhill W, M.D., M.S. Interventional Cardiologist   Pager # [phone]

## 2014-01-29 NOTE — Progress Notes (Signed)
UR Completed [place] Graefe Graves-Bigelow, RN,BSN [phone]  

## 2014-01-29 NOTE — Progress Notes (Signed)
  Echocardiogram 2D Echocardiogram has been performed.  Larry [name], Larry [name] 01/29/2014, 1:45 PM

## 2014-01-29 NOTE — Progress Notes (Signed)
BP elevated at 158/114. HR=82.  Pt asymptomatic and had been up walking to RN station.  MD had increased his Daily Amlodipine to 10mg  but only had 5mg  early this morning before his nuclear stress stest.  RN called Pharmacist who agreed to put in a one time dose for 5mg  so that pt will get a total of 10mg  today.  Will re check BP.

## 2014-01-29 NOTE — Progress Notes (Signed)
Subjective: Some chest pain last pm not severe  Objective: Vital signs in last 24 hours: Temp:  [97.7 F (36.5 C)-98.3 F (36.8 C)] 97.7 F (36.5 C) (12/16 0526) Pulse Rate:  [68-94] 68 (12/16 0526) Resp:  [13-37] 20 (12/16 0526) BP: (132-199)/(82-126) 167/95 mmHg (12/16 0526) SpO2:  [90 %-99 %] 93 % (12/16 0526) Weight:  [246 lb (111.585 kg)-248 lb 11.2 oz (112.81 kg)] 246 lb (111.585 kg) (12/16 0526) Weight change:    Intake/Output from previous day: +152 12/15 0701 - 12/16 0700 In: 905.7 [P.O.:715; I.V.:190.7] Out: 750 [Urine:750] Intake/Output this shift:    PE: General:Pleasant affect, NAD, up and about in the room Skin:Warm and dry, brisk capillary refill HEENT:normocephalic, sclera clear, mucus membranes moist Heart:S1S2 RRR without murmur, gallup, rub or click Lungs:clear without rales, rhonchi, or wheezes ZOX:WRUEAAbd:obese, soft, non tender, + BS, do not palpate liver spleen or masses Ext:no lower ext edema, 2+ pedal pulses, 2+ radial pulses Neuro:alert and oriented X 3, MAE, follows commands, + facial symmetry   Lab Results:  Recent Labs  01/28/14 1444  WBC 7.3  HGB 15.3  HCT 44.8  PLT 206   BMET  Recent Labs  01/28/14 1444 01/29/14 0320  NA 145 145  K 3.0* 2.7*  CL 101 104  CO2 30 27  GLUCOSE 124* 102*  BUN 15 14  CREATININE 1.04 0.88  CALCIUM 9.3 8.3*    Recent Labs  01/28/14 1822  TROPONINI <0.30    No results found for: CHOL, HDL, LDLCALC, LDLDIRECT, TRIG, CHOLHDL No results found for: [id number]   Lab Results  Component Value Date   TSH 0.957 01/28/2014    Hepatic Function Panel  Recent Labs  01/29/14 0320  PROT 6.3  ALBUMIN 3.3*  AST 19  ALT 17  ALKPHOS 78  BILITOT 0.4   No results for input(s): CHOL in the last 72 hours. No results for input(s): PROTIME in the last 72 hours.     Studies/Results: Dg Chest 2 View  01/28/2014   CLINICAL DATA:  Chest pain since 9 a.m. Dizziness. Smoker 1 pack per day.  EXAM:  CHEST  2 VIEW  COMPARISON:  10/11/2006  FINDINGS: The heart size and mediastinal contours are within normal limits. Both lungs are clear. The visualized skeletal structures are unremarkable.  IMPRESSION: No active cardiopulmonary disease.   Electronically Signed   By: Elige KoHetal  Patel   On: 01/28/2014 16:11    Medications: I have reviewed the patient's current medications. Scheduled Meds: . amLODipine  5 mg Oral Daily  . atorvastatin  40 mg Oral q1800  . metoprolol succinate  50 mg Oral Daily  . pneumococcal 23 valent vaccine  0.5 mL Intramuscular Tomorrow-1000  . potassium chloride  10 mEq Oral BID  . sodium chloride  3 mL Intravenous Q12H   Continuous Infusions: . heparin 1,700 Units/hr (01/29/14 0450)   PRN Meds:.sodium chloride, acetaminophen, ALPRAZolam, labetalol, nitroGLYCERIN, ondansetron (ZOFRAN) IV, sodium chloride, zolpidem  Assessment/Plan: 58 year old male presented emergency room today with symptoms that are relatively atypical sounding with left upper chest discomfort waxing and waning lasting a minute or 2 each occasion. Not associated with dyspnea or particular with rest or exertion. He does have some mild inferior lead ST segment depressions on EKG that is probably related to LVH but cannot be certain. I don't have an old EKG to compare to.  His symptoms sound relatively atypical, and his initial cardiac enzymes were negative. My initially  conation was to allow him to be discharged with an outpatient stress test, however while we were discussing this potential option with him, he had a 10-15 beat run of wide complex tachycardia concerning for ventricular tachycardia. He notes that he is felt this type of palpitation episode intermittently for about a year now.  Principal Problem:   Chest pain with moderate risk for cardiac etiology- initial troponin neg, no further troponins ordered- 2 episodes of chest pain during the night.  EKG stable. For lexiscan myoivew today,  Continues  on heparin.  Will check tropoinin   Active Problems:   Hypertension- BP continues to be elevated. 168/106 will give am dose of norvasc now. echo pending   Hyperlipidemia   VT (ventricular tachycardia)-no further VT rare PVC- if nuc neg, and echo stable may need 30 day event monitor   Unstable angina   Hypokalemia- has rec'd 40 meq, will repeat dose and recheck K+   LOS: 1 day   Time spent with pt. :15 minutes. [facility] HamiltonNGOLD,LAURA R  Nurse Practitioner Certified Pager [phone] or after 5pm and on weekends call [phone] 01/29/2014, 8:08 AM   I have seen, examined and evaluated the patient this PM along with Shelda JakesLaura Ingold-NP-C.  After reviewing all the available data and chart,  I agree with her findings, examination as well as impression recommendations.  Admitted with atypical sounding chest discomfort but with a short run of nonsustained ventricular tachycardia that was noted with symptoms, we felt it best to rule out for MI. He has had negative cardiac biomarkers from last night but was not rechecked.  He did have a Myoview performed this morning that I just found out does not show signs of myocardial ischemia, but does suggest increased tracer uptake in the neck. Recommendations of radiology is to obtain a ultrasound evaluation. Echocardiogram still pending. He is still quite hypertensive but has not had any additional nonsustained V. tach episodes. He is on a dose of beta blocker. With a nonischemic nuclear stress test, pending echocardiographic determination of whether there is a structural normality, I think he could probably be discharged later this afternoon, if stable.  Also of note is that he has hypokalemia and hypertension which is concerning for possible hyperaldosteronism. We have initiated a workup for this but I suspect that some levels will need to be rechecked off of medications. As we are currently in the process of evaluating cardiac issues I would prefer not to make medication  adjustments from that perspective. Will I will use an ACE inhibitor or ARB symptoms doesn't know these will need to be stopped for more formal hyperaldosteronism evaluation.  He does have quite okay leaving, and spironolactone would be a good option. Unfortunately that also will distort hyperaldosteronism evaluation.  I have increased the amlodipine dose to 10 mg. He is on moderate dose of atorvastatin. He is also on a beta blocker which, although it does affect the renal aldosterone angiotensin system, will need to be continued because of the nonsustained V. Tach.    Marykay LexHARDING,Nirav Sweda W, M.D., M.S. Interventional Cardiologist   Pager # [phone]     ]

## 2014-01-29 NOTE — Progress Notes (Signed)
ANTICOAGULATION CONSULT NOTE  Pharmacy Consult for Heparin Indication: chest pain/ACS  No Known Allergies  Patient Measurements: Height: 5\' 10"  (177.8 cm) Weight: 248 lb 11.2 oz (112.81 kg) IBW/kg (Calculated) : 73 Heparin Dosing Weight: 98 kg  Vital Signs: Temp: 97.7 F (36.5 C) (12/16 0526) Temp Source: Oral (12/16 0526) BP: 165/110 mmHg (12/16 1125) Pulse Rate: 73 (12/16 1125)  Labs:  Recent Labs  01/28/14 1444 01/28/14 1822 01/28/14 2225 01/29/14 0320 01/29/14 1309  HGB 15.3  --   --   --   --   HCT 44.8  --   --   --   --   PLT 206  --   --   --   --   APTT  --   --  84*  --   --   LABPROT  --  11.7  --   --   --   INR  --  0.85  --   --   --   HEPARINUNFRC  --   --   --  0.10* 0.19*  CREATININE 1.04  --   --  0.88  --   TROPONINI  --  <0.30  --   --   --     Estimated Creatinine Clearance: 115.8 mL/min (by C-G formula based on Cr of 0.88).  Assessment: 58 year old male with chest pain who continues on heparin. He required a dose increase early this morning and a repeat level is still low at 0.19 units/mL. Drip was not stopped and no issues with line. His myoview was negative for ischemia- an ECHO was just performed to determine if there is a structural abnormality.  CBC nml, no bleeding noted.  Goal of Therapy:  Heparin level 0.3-0.7 units/ml Monitor platelets by anticoagulation protocol: Yes   Plan:  1. Increase heparin to 1900 units/hr 2. Heparin level in 6 hours if patient is still here 3. Daily heparin level and CBC while on therapy  Ashley Bultema D. Terri Rorrer, PharmD, BCPS Clinical Pharmacist Pager: [phone] 01/29/2014 2:21 PM

## 2014-01-29 NOTE — Care Management Note (Signed)
    Page 1 of 1   01/29/2014     3:14:46 PM CARE MANAGEMENT NOTE 01/29/2014  Patient:  Larry [name],Larry L   Account Number:  [id number]  Date Initiated:  01/29/2014  Documentation initiated by:  GRAVES-BIGELOW,Jernie Schutt  Subjective/Objective Assessment:   Pt admitted for cp and nausea x 3 days.     Action/Plan:   No needs identified by CM at this time. Pt has insurance.   Anticipated DC Date:  01/30/2014   Anticipated DC Plan:  HOME/SELF CARE      DC Planning Services  CM consult      Choice offered to / List presented to:             Status of service:  Completed, signed off Medicare Important Message given?  NO (If response is "NO", the following Medicare IM given date fields will be blank) Date Medicare IM given:   Medicare IM given by:   Date Additional Medicare IM given:   Additional Medicare IM given by:    Discharge Disposition:  HOME/SELF CARE  Per UR Regulation:  Reviewed for med. necessity/level of care/duration of stay  If discussed at Long Length of Stay Meetings, dates discussed:    Comments:

## 2014-01-29 NOTE — Progress Notes (Signed)
ANTICOAGULATION CONSULT NOTE  Pharmacy Consult for Heparin Indication: chest pain/ACS  No Known Allergies  Patient Measurements: Height: 5\' 10"  (177.8 cm) Weight: 248 lb 11.2 oz (112.81 kg) IBW/kg (Calculated) : 73 Heparin Dosing Weight: 97.7 kg  Vital Signs: Temp: 98.3 F (36.8 C) (12/15 2050) Temp Source: Oral (12/15 2050) BP: 168/106 mmHg (12/15 2050) Pulse Rate: 78 (12/15 2050)  Labs:  Recent Labs  01/28/14 1444 01/28/14 1822 01/28/14 2225 01/29/14 0320  HGB 15.3  --   --   --   HCT 44.8  --   --   --   PLT 206  --   --   --   APTT  --   --  84*  --   LABPROT  --  11.7  --   --   INR  --  0.85  --   --   HEPARINUNFRC  --   --   --  0.10*  CREATININE 1.04  --   --   --   TROPONINI  --  <0.30  --   --     Estimated Creatinine Clearance: 98.5 mL/min (by C-G formula based on Cr of 1.04).  Assessment: 58 year old male with chest pain for heparin  Goal of Therapy:  Heparin level 0.3-0.7 units/ml Monitor platelets by anticoagulation protocol: Yes   Plan:  Heparin 2000 units IV bolus, then increase heparin 1700 units/hr Check heparin level in 6 hours.   Geannie RisenGreg Nellene Courtois, PharmD, BCPS  01/29/2014,4:19 AM

## 2014-01-30 ENCOUNTER — Encounter (HOSPITAL_COMMUNITY): Payer: Self-pay | Admitting: Physician Assistant

## 2014-01-30 ENCOUNTER — Other Ambulatory Visit: Payer: Self-pay | Admitting: Cardiology

## 2014-01-30 DIAGNOSIS — R739 Hyperglycemia, unspecified: Secondary | ICD-10-CM | POA: Diagnosis present

## 2014-01-30 DIAGNOSIS — R931 Abnormal findings on diagnostic imaging of heart and coronary circulation: Secondary | ICD-10-CM | POA: Diagnosis not present

## 2014-01-30 DIAGNOSIS — E785 Hyperlipidemia, unspecified: Secondary | ICD-10-CM | POA: Diagnosis not present

## 2014-01-30 DIAGNOSIS — E041 Nontoxic single thyroid nodule: Secondary | ICD-10-CM

## 2014-01-30 DIAGNOSIS — R079 Chest pain, unspecified: Secondary | ICD-10-CM | POA: Insufficient documentation

## 2014-01-30 DIAGNOSIS — E876 Hypokalemia: Secondary | ICD-10-CM

## 2014-01-30 DIAGNOSIS — I152 Hypertension secondary to endocrine disorders: Secondary | ICD-10-CM

## 2014-01-30 DIAGNOSIS — I10 Essential (primary) hypertension: Secondary | ICD-10-CM | POA: Diagnosis not present

## 2014-01-30 LAB — CBC
HEMATOCRIT: 43.8 % (ref 39.0–52.0)
HEMOGLOBIN: 14.8 g/dL (ref 13.0–17.0)
MCH: 29.8 pg (ref 26.0–34.0)
MCHC: 33.8 g/dL (ref 30.0–36.0)
MCV: 88.3 fL (ref 78.0–100.0)
Platelets: 178 10*3/uL (ref 150–400)
RBC: 4.96 MIL/uL (ref 4.22–5.81)
RDW: 14.5 % (ref 11.5–15.5)
WBC: 6.1 10*3/uL (ref 4.0–10.5)

## 2014-01-30 LAB — BASIC METABOLIC PANEL
Anion gap: 13 (ref 5–15)
BUN: 9 mg/dL (ref 6–23)
CALCIUM: 8.5 mg/dL (ref 8.4–10.5)
CO2: 27 mEq/L (ref 19–32)
Chloride: 103 mEq/L (ref 96–112)
Creatinine, Ser: 0.69 mg/dL (ref 0.50–1.35)
GLUCOSE: 110 mg/dL — AB (ref 70–99)
POTASSIUM: 3 meq/L — AB (ref 3.7–5.3)
Sodium: 143 mEq/L (ref 137–147)

## 2014-01-30 LAB — HEMOGLOBIN A1C
HEMOGLOBIN A1C: 6 % — AB (ref <5.7)
Mean Plasma Glucose: 126 mg/dL — ABNORMAL HIGH (ref <117)

## 2014-01-30 LAB — MAGNESIUM: Magnesium: 2 mg/dL (ref 1.5–2.5)

## 2014-01-30 LAB — HEPARIN LEVEL (UNFRACTIONATED)

## 2014-01-30 MED ORDER — AMLODIPINE BESYLATE 10 MG PO TABS: 10.0000 mg | ORAL_TABLET | Freq: Every day | ORAL | Status: DC

## 2014-01-30 MED ORDER — POTASSIUM CHLORIDE CRYS ER 20 MEQ PO TBCR: 40.0000 meq | EXTENDED_RELEASE_TABLET | Freq: Two times a day (BID) | ORAL | Status: DC

## 2014-01-30 MED ORDER — POTASSIUM CHLORIDE CRYS ER 20 MEQ PO TBCR
40.0000 meq | EXTENDED_RELEASE_TABLET | Freq: Two times a day (BID) | ORAL | Status: DC
  Administered 2014-01-30: 40 meq via ORAL

## 2014-01-30 MED ORDER — METOPROLOL SUCCINATE ER 50 MG PO TB24: 50.0000 mg | ORAL_TABLET | Freq: Every day | ORAL | Status: DC

## 2014-01-30 NOTE — Discharge Instructions (Signed)
We will schedule you a follow up for next week and arrange for enodcrine consult for outpt.  To evaluate your thyroid nodules.   Heart healthy diet.  Call if any problems or questions

## 2014-01-30 NOTE — Progress Notes (Addendum)
Patient Name: Larry [name] Date of Encounter: 01/30/2014  Principal Problem:   Chest pain with moderate risk for cardiac etiology Active Problems:   Hypertension   Hyperlipidemia   VT (ventricular tachycardia)   Unstable angina   Hypokalemia   Hyperglycemia   Thyroid nodule   Primary Cardiologist: Dr. Herbie BaltimoreHarding  Patient Profile: 58 yo male w/ no hx CAD, hx HTN, hypokalemia, was admitted 12/15 w/ chest pain, MV low risk, thyroid nodule seen, CBGs elevated.  SUBJECTIVE: Feels well, no more chest pain or SOB  OBJECTIVE Filed Vitals:   01/29/14 1915 01/29/14 2024 01/30/14 0425 01/30/14 0532  BP: 153/92 150/92  147/92  Pulse:  81  65  Temp:  98.4 F (36.9 C)  98.2 F (36.8 C)  TempSrc:  Oral  Oral  Resp:      Height:      Weight:   246 lb 4.8 oz (111.721 kg)   SpO2:  98%  99%    Intake/Output Summary (Last 24 hours) at 01/30/14 0833 Last data filed at 01/30/14 0750  Gross per 24 hour  Intake 763.72 ml  Output   2295 ml  Net -1531.28 ml   Filed Weights   01/28/14 2050 01/29/14 0526 01/30/14 0425  Weight: 248 lb 11.2 oz (112.81 kg) 246 lb (111.585 kg) 246 lb 4.8 oz (111.721 kg)    PHYSICAL EXAM General: Well developed, well nourished, male in no acute distress. Head: Normocephalic, atraumatic.  Neck: Supple without bruits, JVD not elevated. Lungs:  Resp regular and unlabored, CTA. Heart: RRR, S1, S2, no S3, S4, or murmur; no rub. Abdomen: Soft, non-tender, non-distended, BS + x 4.  Extremities: No clubbing, cyanosis, no edema.  Neuro: Alert and oriented X 3. Moves all extremities spontaneously. Psych: Normal affect.  LABS: CBC:  Recent Labs  01/28/14 1444 01/30/14 0436  WBC 7.3 6.1  HGB 15.3 14.8  HCT 44.8 43.8  MCV 87.8 88.3  PLT 206 178   INR:  Recent Labs  01/28/14 1822  INR 0.85   Basic Metabolic Panel: Recent Labs  01/28/14 1822  01/29/14 1309 01/30/14 0436  NA  --   < > 141 143  K  --   < > 3.1* 3.0*  CL  --   < > 100 103    CO2  --   < > 29 27  GLUCOSE  --   < > 164* 110*  BUN  --   < > 12 9  CREATININE  --   < > 0.79 0.69  CALCIUM  --   < > 8.5 8.5  MG 2.1  --   --  2.0  < > = values in this interval not displayed. Liver Function Tests:  Recent Labs  01/29/14 0320  AST 19  ALT 17  ALKPHOS 78  BILITOT 0.4  PROT 6.3  ALBUMIN 3.3*   Cardiac Enzymes:  Recent Labs  01/28/14 1822 01/29/14 1309 01/29/14 2035  TROPONINI <0.30 <0.30 <0.30    Recent Labs  01/28/14 1511  TROPIPOC 0.00   BNP: PRO B NATRIURETIC PEPTIDE (BNP)  Date/Time Value Ref Range Status  01/28/2014 06:22 PM 114.2 0 - 125 pg/mL Final   Thyroid Function Tests:  Recent Labs  01/28/14 1822  TSH 0.957   TELE:  SR, ?Sinus arrhythmia vs PACs  Radiology/Studies: Dg Chest 2 View 01/28/2014   CLINICAL DATA:  Chest pain since 9 a.m. Dizziness. Smoker 1 pack per day.  EXAM: CHEST  2 VIEW  COMPARISON:  10/11/2006  FINDINGS: The heart size and mediastinal contours are within normal limits. Both lungs are clear. The visualized skeletal structures are unremarkable.  IMPRESSION: No active cardiopulmonary disease.   Electronically Signed   By: Elige KoHetal  Patel   On: 01/28/2014 16:11   [place] Soft Tissue Head/neck 01/29/2014   CLINICAL DATA:  Increased uptake in the neck on the nuclear medicine cardiac study.  EXAM: THYROID ULTRASOUND  TECHNIQUE: Ultrasound examination of the thyroid gland and adjacent soft tissues was performed.  COMPARISON:  None.  FINDINGS: Right thyroid lobe  Measurements: 6.6 x 4.0 x 4.4 cm. Complex interpolar region nodule measures 1.6 x 1.7 x 1.9 cm  Left thyroid lobe  Measurements: 7.5 x 4.6 x 5.0 cm. Solid interpolar region nodule measures 3.4 x 2.7 x 2.3 cm  Isthmus  Thickness: 1.1 cm.  No nodules visualized.  Lymphadenopathy  None visualized.  IMPRESSION: Bilateral nodules. Dominant left nodule measures 3.4 cm. Findings meet consensus criteria for biopsy. Ultrasound-guided fine needle aspiration should be considered, as  per the consensus statement: Management of Thyroid Nodules Detected at US: Society of Radiologists in Ultrasound Consensus Conference Statement. Radiology 2005; [id number].   Electronically Signed   By: Maryclare BeanArt  Hoss M.D.   On: 01/29/2014 16:04   Nm Myocar Multi W/spect W/wall Motion / Ef 01/29/2014   CLINICAL DATA:  58 year old with atypical chest pain, upper chest, nausea, trouble swallowing on morning of observation.  EXAM: MYOCARDIAL IMAGING WITH SPECT (REST AND PHARMACOLOGIC-STRESS)  GATED LEFT VENTRICULAR WALL MOTION STUDY  LEFT VENTRICULAR EJECTION FRACTION  TECHNIQUE: Standard myocardial SPECT imaging was performed after resting intravenous injection of 10 mCi Tc-5342m sestamibi. Subsequently, intravenous infusion of Lexiscan was performed under the supervision of the Cardiology staff. At peak effect of the drug, 30 mCi Tc-9442m sestamibi was injected intravenously and standard myocardial SPECT imaging was performed. Quantitative gated imaging was also performed to evaluate left ventricular wall motion, and estimate left ventricular ejection fraction.  COMPARISON:  None.  FINDINGS: Perfusion: No decreased activity in the left ventricle on stress imaging to suggest reversible ischemia or infarction. Raw images at rest demonstrate generous amount of thyroid uptake.  Wall Motion: Normal left ventricular wall motion. No left ventricular dilation.  Left Ventricular Ejection Fraction: 50 %  End diastolic volume 162 ml  End systolic volume 82 ml  IMPRESSION: 1. No reversible ischemia or infarction. There is generous radiotracer (sestamibi) uptake in bilateral thyroid region (this may represent normal thyroid uptake). Physical exam of neck recommended. If abnormal, consider thyroid ultrasound.  2. Normal left ventricular wall motion.  3. Left ventricular ejection fraction 50%  4. Low-risk stress test findings*.  *2012 Appropriate Use Criteria for Coronary Revascularization Focused Update: J Am Coll Cardiol.  2012;59(9):857-881. http://content.dementiazones.comonlinejacc.org/article.aspx?articleid=1201161   Electronically Signed   By: Donato SchultzMark  Skains   On: 01/29/2014 13:25     Current Medications:  . amLODipine  10 mg Oral Daily  . atorvastatin  40 mg Oral q1800  . metoprolol succinate  50 mg Oral Daily  . pneumococcal 23 valent vaccine  0.5 mL Intramuscular Tomorrow-1000  . potassium chloride  10 mEq Oral BID  . sodium chloride  3 mL Intravenous Q12H      ASSESSMENT AND PLAN: Principal Problem:   Chest pain with moderate risk for cardiac etiology - ez neg MI, MV was low-risk, echo OK. No further cardiac workup  Active Problems:   Hypertension (essential vs. Endocrine related) - SBP still > 145 on current rx, ?increase Toprol XL  to 75 or 100 mg daily.    Hyperlipidemia - on statin    VT (ventricular tachycardia) - none seen overnight, EF OK by echo & non-ischemic Myoview.  Continue BB.    Unstable angina - see above; Negative Myoview    Hypokalemia - req supplement daily, cortisol am was low, PRA labs pending.    Hyperglycemia - ck A1c    Thyroid Nodule - seen on MV and [place]   Signed, Theodore Demark , PA-C 8:33 AM 01/30/2014   I have seen, examined and evaluated the patient this AM along with Noralyn Pick, PA.  After reviewing all the available data and chart,  I agree with her findings, examination as well as impression recommendations.  Essentially benign cardiology workup => but interesting findings of Thyroid Nodule, HypoKalemia/HTN (concernign for HyperAldosteronism).  Endocrinology has been consulted - ? If any additionaly w/u is needed as In-pt -- if not, OK for d/c from cardiac standpoint.  BP is difficult to Rx - & would like to have HyperAldo ? Answered to allow use of ARB/ACE-I & Spironolactone.  Will await Endocrine C/s recommendations & anticipate d/c soon.  Marykay Lex, M.D., M.S. Interventional Cardiologist   Pager # [phone]

## 2014-01-30 NOTE — Discharge Summary (Signed)
CARDIOLOGY DISCHARGE SUMMARY   Patient ID: MAJOR SANTERRE MRN: [id number] DOB/AGE: 1955-10-14 58 y.o.  Admit date: 01/28/2014 Discharge date: 01/30/2014  PCP: No primary care provider on file. Primary Cardiologist: Dr. Herbie Baltimore  Primary Discharge Diagnosis: Chest pain with minimal risk for cardiac etiology Secondary Discharge Diagnosis:    Essential hypertension   Hyperlipidemia   VT (ventricular tachycardia)   Unstable angina   Hypokalemia   Hyperglycemia   Thyroid nodule   Hypertension due to endocrine disorder  Consults: Endocrinology  Procedures: Ultrasound soft tissue head and neck, Lexi scan Myoview, 2-D echocardiogram  Hospital Course: JAELEN SOTH is a 58 y.o. male with no history of CAD. He has a history of hypertension and hyperkalemia. On the day of admission he had onset of left-sided chest pain while at work. The episodes were brief. He had multiple episodes. He came to the hospital. In the emergency room, his chest pain was felt to be atypical, however his potassium was low and he was having nonsustained VT. He was admitted for further evaluation and treatment.  His cardiac enzymes were negative for MI. A Lexi scan Myoview was performed on 12/16, results are below. It was a low risk study, negative for scar or ischemia. A 2-D echocardiogram was performed and results are below. His EF is preserved.  There was concern because his potassium continued to require supplementation and his blood pressure was significantly elevated. A morning cortisol level was drawn and was low. PRA labs were sent and are pending. On the Myoview, uptake was seen in the thyroid and there was concern for thyroid nodule. Thyroid ultrasound was ordered, results are below. It showed multiple thyroid nodules. His potassium did not improve despite supplementation, and it was felt the thyroid nodules needed further evaluation. An endocrinology consult was called but unable to be done.  On  12/17, he was seen by Dr. Herbie Baltimore and all data were reviewed. His blood pressure was improved with medical therapy. His chest pain had resolved. He had been started on a beta blocker and was having no further episodes of ventricular tachycardia.  He was not seen by endocrinology, but we will arrange an outpt visit.  He are discharging on KDUR 40 meq BID per Dr. Herbie Baltimore.  He will follow up with TCM appt and have labs done on the 19th.  He will follow up with Dr. Herbie Baltimore in 6 weeks.  Pt stated he could not afford the lab work or the office visit.  He was very stressed about not seeing the Endocrinologist here where he might could afford it now it will most likely be after the first of the year.   He will check his BP and call it to the office tomorrow so if controlled he can return to work.      Labs:   Lab Results  Component Value Date   WBC 6.1 01/30/2014   HGB 14.8 01/30/2014   HCT 43.8 01/30/2014   MCV 88.3 01/30/2014   PLT 178 01/30/2014    Recent Labs Lab 01/29/14 0320  01/30/14 0436  NA 145  < > 143  K 2.7*  < > 3.0*  CL 104  < > 103  CO2 27  < > 27  BUN 14  < > 9  CREATININE 0.88  < > 0.69  CALCIUM 8.3*  < > 8.5  PROT 6.3  --   --   BILITOT 0.4  --   --   ALKPHOS 78  --   --  ALT 17  --   --   AST 19  --   --   GLUCOSE 102*  < > 110*  < > = values in this interval not displayed.  Recent Labs  01/28/14 1822 01/29/14 1309 01/29/14 2035  TROPONINI <0.30 <0.30 <0.30   PRO B NATRIURETIC PEPTIDE (BNP)  Date/Time Value Ref Range Status  01/28/2014 06:22 PM 114.2 0 - 125 pg/mL Final    Recent Labs  01/28/14 1822  INR 0.85   Lab Results  Component Value Date   HGBA1C 6.0* 01/30/2014     Radiology: Dg Chest 2 View 01/28/2014   CLINICAL DATA:  Chest pain since 9 a.m. Dizziness. Smoker 1 pack per day.  EXAM: CHEST  2 VIEW  COMPARISON:  10/11/2006  FINDINGS: The heart size and mediastinal contours are within normal limits. Both lungs are clear. The visualized  skeletal structures are unremarkable.  IMPRESSION: No active cardiopulmonary disease.   Electronically Signed   By: Elige KoHetal  Patel   On: 01/28/2014 16:11   [place] Soft Tissue Head/neck 01/29/2014   CLINICAL DATA:  Increased uptake in the neck on the nuclear medicine cardiac study.  EXAM: THYROID ULTRASOUND  TECHNIQUE: Ultrasound examination of the thyroid gland and adjacent soft tissues was performed.  COMPARISON:  None.  FINDINGS: Right thyroid lobe  Measurements: 6.6 x 4.0 x 4.4 cm. Complex interpolar region nodule measures 1.6 x 1.7 x 1.9 cm  Left thyroid lobe  Measurements: 7.5 x 4.6 x 5.0 cm. Solid interpolar region nodule measures 3.4 x 2.7 x 2.3 cm  Isthmus  Thickness: 1.1 cm.  No nodules visualized.  Lymphadenopathy  None visualized.  IMPRESSION: Bilateral nodules. Dominant left nodule measures 3.4 cm. Findings meet consensus criteria for biopsy. Ultrasound-guided fine needle aspiration should be considered, as per the consensus statement: Management of Thyroid Nodules Detected at US: Society of Radiologists in Ultrasound Consensus Conference Statement. Radiology 2005; [id number].   Electronically Signed   By: Maryclare BeanArt  Hoss M.D.   On: 01/29/2014 16:04   Nm Myocar Multi W/spect W/wall Motion / Ef 01/29/2014   CLINICAL DATA:  58 year old with atypical chest pain, upper chest, nausea, trouble swallowing on morning of observation.  EXAM: MYOCARDIAL IMAGING WITH SPECT (REST AND PHARMACOLOGIC-STRESS)  GATED LEFT VENTRICULAR WALL MOTION STUDY  LEFT VENTRICULAR EJECTION FRACTION  TECHNIQUE: Standard myocardial SPECT imaging was performed after resting intravenous injection of 10 mCi Tc-925m sestamibi. Subsequently, intravenous infusion of Lexiscan was performed under the supervision of the Cardiology staff. At peak effect of the drug, 30 mCi Tc-7925m sestamibi was injected intravenously and standard myocardial SPECT imaging was performed. Quantitative gated imaging was also performed to evaluate left ventricular wall  motion, and estimate left ventricular ejection fraction.  COMPARISON:  None.  FINDINGS: Perfusion: No decreased activity in the left ventricle on stress imaging to suggest reversible ischemia or infarction. Raw images at rest demonstrate generous amount of thyroid uptake.  Wall Motion: Normal left ventricular wall motion. No left ventricular dilation.  Left Ventricular Ejection Fraction: 50 %  End diastolic volume 162 ml  End systolic volume 82 ml  IMPRESSION: 1. No reversible ischemia or infarction. There is generous radiotracer (sestamibi) uptake in bilateral thyroid region (this may represent normal thyroid uptake). Physical exam of neck recommended. If abnormal, consider thyroid ultrasound.  2. Normal left ventricular wall motion.  3. Left ventricular ejection fraction 50%  4. Low-risk stress test findings*.  *2012 Appropriate Use Criteria for Coronary Revascularization Focused Update: J Am Coll Cardiol. 2012;59(9):857-881. http://content.dementiazones.comonlinejacc.org/article.aspx?articleid=1201161  Electronically Signed   By: Donato SchultzMark  Skains   On: 01/29/2014 13:25   EKG: 01/30/2014 Sinus rhythm with PACs No acute ischemic changes  Echo: 01/29/2014 Conclusions - Left ventricle: The cavity size was normal. There was moderate asymmetric hypertrophy. Systolic function was normal. EF ~ 55% to 60%. There were no regional wall motion abnormalities. Abnormal left ventricular relaxation (grade 1 diastolic dysfunction). - Left atrium: The atrium was moderately to severely dilated. - Atrial septum: No defect or patent foramen ovale was identified.  FOLLOW UP PLANS AND APPOINTMENTS No Known Allergies   Medication List    TAKE these medications        amLODipine 10 MG tablet  Commonly known as:  NORVASC  Take 1 tablet (10 mg total) by mouth daily.     aspirin 81 MG tablet  Take 81 mg by mouth daily.     metoprolol succinate 50 MG 24 hr tablet  Commonly known as:  TOPROL-XL  Take 1 tablet (50 mg total) by mouth  daily.     potassium chloride SA 20 MEQ tablet  Commonly known as:  K-DUR,KLOR-CON  Take 2 tablets (40 mEq total) by mouth 2 (two) times daily.     simvastatin 10 MG tablet  Commonly known as:  ZOCOR  Take 10 mg by mouth daily.         Follow-up Information    Follow up with Marykay LexHARDING, Dilara Navarrete W, MD.   Specialty:  Cardiology   Why:  The office will call.   Contact information:   [address] [place] [address] [phone]       BRING ALL MEDICATIONS WITH YOU TO FOLLOW UP APPOINTMENTS  Time spent with patient to include physician time: 43 min Signed: INGOLD,LAURA R, NP 01/30/2014, 7:17 PM Co-Sign MD   He was admitted for chest discomfort somewhat atypical but in the setting of having ventricular tachycardia and wanted evaluation. Negative Myoview for ischemia but did have abnormal findings with a thyroid nodule indicated. He also thrills a normal echocardiogram. Thyroid oversewn showed a thyroid nodule would recommended FNA biopsy.  Unfortunately, we were under the impression that endocrinology was counseled to, however the on-call endocrinologist did not indicate that he was called indicating a gapping communication. Therefore the patient was not seen as it dissipated by endocrinology he has no active cardiac issues he is okay for discharge with close followup. He will be arranged to see me in followup for cardiology respiratory modification. Her mostly hypertension management. I also want him to see endocrinology for the thyroid nodule as well as his hypokalemia and hypertension as possible hyperaldosteronism.  Marykay LexHARDING, Delphina Schum W, M.D., M.S. Interventional Cardiologist   Pager # [phone]

## 2014-01-31 ENCOUNTER — Telehealth: Payer: Self-pay | Admitting: Cardiology

## 2014-01-31 DIAGNOSIS — Z79899 Other long term (current) drug therapy: Secondary | ICD-10-CM

## 2014-01-31 DIAGNOSIS — I472 Ventricular tachycardia, unspecified: Secondary | ICD-10-CM

## 2014-01-31 DIAGNOSIS — I10 Essential (primary) hypertension: Secondary | ICD-10-CM

## 2014-01-31 MED ORDER — HYDRALAZINE HCL 50 MG PO TABS: 50.0000 mg | ORAL_TABLET | Freq: Two times a day (BID) | ORAL | Status: DC

## 2014-01-31 NOTE — Telephone Encounter (Signed)
Closed encounter °

## 2014-01-31 NOTE — Telephone Encounter (Signed)
Pt is calling in stating that he was released from the hospital yesterday for chest pain and he needs a form from Dr. Herbie BaltimoreHarding stating when he can go back to work. Please call as soon as possible he stated his job is in jeopardy.   Thanks

## 2014-01-31 NOTE — Telephone Encounter (Signed)
Spoke patient wife . Patient was not home yet. Informed wife form will be filled out next week. Medication was e-sent to pharmacy  Lab linked to appointment on 02/04/14

## 2014-01-31 NOTE — Telephone Encounter (Signed)
OK to go back to work. Lets Add Hydralazine 50 mg bid for BP control. Will need OP Chem panel (if not already ordered) early next week -- he can brink paperwork in for signature. Marykay LexHARDING, DAVID W, MD

## 2014-01-31 NOTE — Telephone Encounter (Signed)
Spoke to patient Patient states he can not return to work until some papers are filled out since he missed more tha 3 days.  RN informed patient he will have to bring forms to office and speak to medical records for the process to begin. Patient verabalized ubderstanding.   RN ASKED PATIENT IF HE CHECKED HIS BLOOD PRESSURE TODAY AS ODER YESTERDAY AT DISCHARGE. PATIENT STATES YES, BLOOD PRESSURE WAS 180/120 PER PATIENT. RN INFORMED PATIENT WILL GIVE INFORMATION TO LAURA NP ,WHO WAS THE PERSON WHO ASSIST IN DISCHARGE YESTERDAY. QUESTION WHEN CAN PATIENT RETURN TO WORK?

## 2014-01-31 NOTE — Telephone Encounter (Signed)
Patient called back  He states he understands RN informed patient to tae medication to office visit. And make sure he picks up HYDRALAZINE FROM PHARMACY.

## 2014-02-03 NOTE — Telephone Encounter (Signed)
Can you add a BMET to his visit.

## 2014-02-04 ENCOUNTER — Other Ambulatory Visit: Payer: 59 | Admitting: *Deleted

## 2014-02-04 ENCOUNTER — Encounter: Payer: Self-pay | Admitting: Nurse Practitioner

## 2014-02-04 ENCOUNTER — Ambulatory Visit (INDEPENDENT_AMBULATORY_CARE_PROVIDER_SITE_OTHER): Payer: 59 | Admitting: Nurse Practitioner

## 2014-02-04 VITALS — BP 140/80 | HR 94 | Ht 70.0 in | Wt 252.0 lb

## 2014-02-04 DIAGNOSIS — E876 Hypokalemia: Secondary | ICD-10-CM

## 2014-02-04 DIAGNOSIS — R079 Chest pain, unspecified: Secondary | ICD-10-CM

## 2014-02-04 DIAGNOSIS — E785 Hyperlipidemia, unspecified: Secondary | ICD-10-CM

## 2014-02-04 DIAGNOSIS — I10 Essential (primary) hypertension: Secondary | ICD-10-CM

## 2014-02-04 LAB — LDL CHOLESTEROL, DIRECT: Direct LDL: 112 mg/dL

## 2014-02-04 LAB — BASIC METABOLIC PANEL
BUN: 12 mg/dL (ref 6–23)
CO2: 25 mEq/L (ref 19–32)
Calcium: 8.6 mg/dL (ref 8.4–10.5)
Chloride: 105 mEq/L (ref 96–112)
Creatinine, Ser: 0.9 mg/dL (ref 0.4–1.5)
GFR: 110.43 mL/min (ref >60.00)
Glucose, Bld: 124 mg/dL — ABNORMAL HIGH (ref 70–99)
Potassium: 2.9 mEq/L — ABNORMAL LOW (ref 3.5–5.1)
Sodium: 140 mEq/L (ref 135–145)

## 2014-02-04 LAB — LIPID PANEL
Cholesterol: 189 mg/dL (ref 0–200)
HDL: 41.9 mg/dL (ref >39.00)
NonHDL: 147.1
Total CHOL/HDL Ratio: 5
Triglycerides: 203 mg/dL — ABNORMAL HIGH (ref 0.0–149.0)
VLDL: 40.6 mg/dL — ABNORMAL HIGH (ref 0.0–40.0)

## 2014-02-04 NOTE — Progress Notes (Signed)
Larry [name] Date of Birth: 1955/11/22 Medical Record #[id number]  History of Present Illness: [name] is seen back today for a post hospital visit. Seen for Dr. Herbie [name]. He is a 58 year old male with no known CAD. Has HTN & HLD.  He was most recently admitted with chest pain. Negative enzymes. Potassium was low and he had runs of NSVT. Potassium was supplemented. BP was high and medicines were adjusted. Has lots of financial issues.   This admission was characterized by NSVT. Echo showed normal EF. Myoview ok - low risk with no scar or ischemia. There was concern for endocrine etiology due to low potassium level and elevated BP. Morning cortisol level was low. PRA labs sent and are pending. Noted to have multiple thyroid nodules on ultrasound - not able to get endocrine consult while admitted. Discharged on Kdur 40 meq BID. Concern that he would not be able to afford an outpatient endocrine visit.  His potassium never noted to be in the normal range. Discharge K was 3.0  Comes in today. Here alone.  His aldosterone + renin lab is still "in process".  He is doing ok. Has a note to return to work that needs to be completed. Does not look like he has been referred to Endocrine. No more chest pain. No palpitations. Has some flank pain. No GU complaints. He tells me he has not smoked since he was in the hospital - different account than what he told my CMA. He is working on puzzles the entire time of our visit. Only one spell of his heart palpitations. Not lightheaded or dizzy.   Current Outpatient Prescriptions  Medication Sig Dispense Refill  . aspirin 81 MG tablet Take 81 mg by mouth daily.    . hydrALAZINE (APRESOLINE) 50 MG tablet Take 1 tablet (50 mg total) by mouth 2 (two) times daily. 60 tablet 6  . metoprolol succinate (TOPROL-XL) 50 MG 24 hr tablet Take 1 tablet (50 mg total) by mouth daily. 30 tablet 11  . potassium chloride (K-DUR,KLOR-CON) 20 MEQ tablet Take 2 tablets (40 mEq  total) by mouth 2 (two) times daily. 120 tablet 6  . simvastatin (ZOCOR) 10 MG tablet Take 10 mg by mouth daily.    Marland Kitchen. amLODipine (NORVASC) 10 MG tablet Take 1 tablet (10 mg total) by mouth daily. 30 tablet 11   No current facility-administered medications for this visit.    No Known Allergies  Past Medical History  Diagnosis Date  . Hypertension   . AR (allergic rhinitis)   . Hyperlipidemia   . Rectal bleed 2001  . Thyroid nodule 01/2014  . Hypokalemia   . Hyperglycemia 01/2014    A1c 6.0    Past Surgical History  Procedure Laterality Date  . Esophagogastroduodenoscopy  2001  . Colonoscopy  2001  . Small bowel enteroscopy  2001    History  Smoking status  . Current Every Day Smoker -- 1.00 packs/day for 40 years  Smokeless tobacco  . Never Used    History  Alcohol Use  . Yes    Comment: "I just drink on the weekend"    Family History  Problem Relation Age of Onset  . Cancer Mother   . Heart failure Mother   . Diabetes Mother   . Hypertension Father   . Hyperlipidemia Father   . Heart attack Father   . Sleep apnea Sister   . Hypertension Sister     Review of Systems: The review of  systems is per the HPI.  All other systems were reviewed and are negative.  Physical Exam: BP 140/80 mmHg  Pulse 94  Ht 5\' 10"  (1.778 m)  Wt 252 lb (114.306 kg)  BMI 36.16 kg/m2  BP 120/80 in the left arm and 110/80 in the right. Patient is alert and in no acute distress. He is obese. Skin is warm and dry. Color is normal.  HEENT is unremarkable. Normocephalic/atraumatic. PERRL. Sclera are nonicteric. Neck is supple. No masses. No JVD. Lungs are coarse with scattered ronchi. Cardiac exam shows a regular rate and rhythm. Abdomen is soft. Extremities are without edema. Gait and ROM are intact. No gross neurologic deficits noted.  Wt Readings from Last 3 Encounters:  02/04/14 252 lb (114.306 kg)  01/30/14 246 lb 4.8 oz (111.721 kg)    LABORATORY DATA/PROCEDURES:  EKG today  with NSR.   Lab Results  Component Value Date   WBC 6.1 01/30/2014   HGB 14.8 01/30/2014   HCT 43.8 01/30/2014   PLT 178 01/30/2014   GLUCOSE 110* 01/30/2014   ALT 17 01/29/2014   AST 19 01/29/2014   NA 143 01/30/2014   K 3.0* 01/30/2014   CL 103 01/30/2014   CREATININE 0.69 01/30/2014   BUN 9 01/30/2014   CO2 27 01/30/2014   TSH 0.957 01/28/2014   INR 0.85 01/28/2014   HGBA1C 6.0* 01/30/2014   No results found for: CHOL, HDL, LDLCALC, LDLDIRECT, TRIG, CHOLHDL BNP (last 3 results)  Recent Labs  01/28/14 1822  PROBNP 114.2   Echo Study Conclusions from December 2015  - Left ventricle: The cavity size was normal. There was moderate asymmetric hypertrophy. Systolic function was normal. The estimated ejection fraction was in the range of 55% to 60%. Wall motion was normal; there were no regional wall motion abnormalities. Doppler parameters are consistent with abnormal left ventricular relaxation (grade 1 diastolic dysfunction). - Left atrium: The atrium was moderately to severely dilated. - Atrial septum: No defect or patent foramen ovale was identified.   THYROID ULTRASOUND  TECHNIQUE: Ultrasound examination of the thyroid gland and adjacent soft tissues was performed.  COMPARISON: None.  FINDINGS: Right thyroid lobe  Measurements: 6.6 x 4.0 x 4.4 cm. Complex interpolar region nodule measures 1.6 x 1.7 x 1.9 cm  Left thyroid lobe  Measurements: 7.5 x 4.6 x 5.0 cm. Solid interpolar region nodule measures 3.4 x 2.7 x 2.3 cm  Isthmus  Thickness: 1.1 cm. No nodules visualized.  Lymphadenopathy  None visualized.  IMPRESSION: Bilateral nodules. Dominant left nodule measures 3.4 cm. Findings meet consensus criteria for biopsy. Ultrasound-guided fine needle aspiration should be considered, as per the consensus statement: Management of Thyroid Nodules Detected at US: Society of Radiologists in Ultrasound Consensus Conference  Statement. Radiology 2005; [id number].  Electronically Signed  By: Maryclare BeanArt Hoss M.D.  On: 01/29/2014 16:04  MYOVIEW IMPRESSION: 1. No reversible ischemia or infarction. There is generous radiotracer (sestamibi) uptake in bilateral thyroid region (this may represent normal thyroid uptake). Physical exam of neck recommended. If abnormal, consider thyroid ultrasound.  2. Normal left ventricular wall motion.  3. Left ventricular ejection fraction 50%  4. Low-risk stress test findings*.  *2012 Appropriate Use Criteria for Coronary Revascularization Focused Update: J Am Coll Cardiol. 2012;59(9):857-881. http://content.dementiazones.comonlinejacc.org/article.aspx?articleid=1201161   Electronically Signed  By: Donato SchultzMark Skains  On: 01/29/2014 13:25   Assessment / Plan: 1. Chest pain - negative Myoview - ok to return to work with no restrictions. Needs CV risk factor modification.   2. NSVT - EKG  ok today - needs labs rechecked.   3. Hypokalemia -  Rechecking labs today  4. Multiple thyroid nodules -  Referring on to Endocrine.  5. Ongoing tobacco abuse - encouraged to stop  6. Borderline DM  7. HTN - BP much improved. We have called the labs. His aldosterone + renin apparently will be resulted tomorrow - not clear as to who will be getting these results. If abnormal - needs Endocrine to address. For now, no change with his current regimen.   See back as planned.   Patient is agreeable to this plan and will call if any problems develop in the interim.   Rosalio Macadamia, RN, ANP-C [facility], [facility] [address] [place], [place]  [address] [phone]

## 2014-02-04 NOTE — Patient Instructions (Addendum)
We will be checking the following labs today BMET & Lipids  Congrats for not smoking!!!  Stay on your current medicines  Needs outpatient endocrine referral for thyroid nodules and BP issues that may be endocrine related - Dr. Lucianne MussKumar  [facility]  Call the [facility] office at [phone] if you have any questions, problems or concerns.

## 2014-02-05 ENCOUNTER — Other Ambulatory Visit: Payer: Self-pay | Admitting: *Deleted

## 2014-02-05 ENCOUNTER — Telehealth: Payer: Self-pay | Admitting: Nurse Practitioner

## 2014-02-05 DIAGNOSIS — E876 Hypokalemia: Secondary | ICD-10-CM

## 2014-02-05 LAB — ALDOSTERONE + RENIN ACTIVITY W/ RATIO
ALDO / PRA Ratio: 128.6 Ratio — ABNORMAL HIGH (ref 0.9–28.9)
Aldosterone: 9 ng/dL
PRA LC/MS/MS: 0.07 ng/mL/h — ABNORMAL LOW (ref 0.25–5.82)

## 2014-02-05 MED ORDER — SPIRONOLACTONE 25 MG PO TABS: 25.0000 mg | ORAL_TABLET | Freq: Every day | ORAL | Status: DC

## 2014-02-05 MED ORDER — POTASSIUM CHLORIDE CRYS ER 20 MEQ PO TBCR: 40.0000 meq | EXTENDED_RELEASE_TABLET | Freq: Three times a day (TID) | ORAL | Status: DC

## 2014-02-05 NOTE — Telephone Encounter (Signed)
Questions answered.

## 2014-02-05 NOTE — Telephone Encounter (Signed)
New Msg ° ° ° ° ° ° °Pt returning call. ° ° °Please call back. °

## 2014-02-06 LAB — ALDOSTERONE + RENIN ACTIVITY W/ RATIO
ALDO / PRA Ratio: 360 Ratio — ABNORMAL HIGH (ref 0.9–28.9)
Aldosterone: 18 ng/dL
PRA LC/MS/MS: 0.05 ng/mL/h — AB (ref 0.25–5.82)

## 2014-02-11 ENCOUNTER — Other Ambulatory Visit (INDEPENDENT_AMBULATORY_CARE_PROVIDER_SITE_OTHER): Payer: 59 | Admitting: *Deleted

## 2014-02-11 ENCOUNTER — Telehealth: Payer: Self-pay | Admitting: *Deleted

## 2014-02-11 ENCOUNTER — Other Ambulatory Visit: Payer: Self-pay | Admitting: *Deleted

## 2014-02-11 DIAGNOSIS — E876 Hypokalemia: Secondary | ICD-10-CM

## 2014-02-11 LAB — BASIC METABOLIC PANEL
BUN: 14 mg/dL (ref 6–23)
CO2: 25 mEq/L (ref 19–32)
Calcium: 8.9 mg/dL (ref 8.4–10.5)
Chloride: 105 mEq/L (ref 96–112)
Creatinine, Ser: 0.9 mg/dL (ref 0.4–1.5)
GFR: 111.84 mL/min (ref >60.00)
Glucose, Bld: 172 mg/dL — ABNORMAL HIGH (ref 70–99)
Potassium: 3.3 mEq/L — ABNORMAL LOW (ref 3.5–5.1)
Sodium: 139 mEq/L (ref 135–145)

## 2014-02-11 NOTE — Telephone Encounter (Signed)
Pt was returned Sharon's call

## 2014-02-11 NOTE — Telephone Encounter (Signed)
Spoke to patient  He states he has an appointment with Dr Lucianne MussKumar on 02/19/14  RN informed to keep appointment. RN will cancel appointment with Dr Talmage NapBalan 03/26/14 Patient verbalized understanding.

## 2014-02-11 NOTE — Telephone Encounter (Signed)
RN CALLED  SPOKE TO WIFE, CONCERNING UPCOMING APPOINTMENT WITH ENDOCRINOLOGIST. SHE STATES PATIENT WAS OUT AT PRESENT TIME AND WILL GIVE HIM THE INFORMATION TO RETURN CALL. A COPY OF APPOINTMENT  WITH DR Talmage NapBALAN MAILED TO PATIENT.

## 2014-02-13 ENCOUNTER — Telehealth: Payer: Self-pay | Admitting: *Deleted

## 2014-02-13 NOTE — Telephone Encounter (Signed)
Called and cancelled appointment 03/26/13 Patient has another appointment with Dr Lucianne MussKUMAR - order by Norma FredricksonLori Gerhardt NP

## 2014-02-18 ENCOUNTER — Other Ambulatory Visit (INDEPENDENT_AMBULATORY_CARE_PROVIDER_SITE_OTHER): Payer: 59

## 2014-02-18 DIAGNOSIS — E876 Hypokalemia: Secondary | ICD-10-CM

## 2014-02-19 ENCOUNTER — Ambulatory Visit (INDEPENDENT_AMBULATORY_CARE_PROVIDER_SITE_OTHER): Payer: 59 | Admitting: Endocrinology

## 2014-02-19 ENCOUNTER — Encounter: Payer: Self-pay | Admitting: Endocrinology

## 2014-02-19 VITALS — BP 152/82 | HR 79 | Temp 98.4°F | Resp 16 | Ht 70.0 in | Wt 253.0 lb

## 2014-02-19 DIAGNOSIS — E041 Nontoxic single thyroid nodule: Secondary | ICD-10-CM

## 2014-02-19 DIAGNOSIS — E269 Hyperaldosteronism, unspecified: Secondary | ICD-10-CM

## 2014-02-19 DIAGNOSIS — E876 Hypokalemia: Secondary | ICD-10-CM

## 2014-02-19 LAB — BASIC METABOLIC PANEL
BUN: 18 mg/dL (ref 6–23)
CALCIUM: 8.9 mg/dL (ref 8.4–10.5)
CO2: 25 mEq/L (ref 19–32)
Chloride: 109 mEq/L (ref 96–112)
Creatinine, Ser: 1.1 mg/dL (ref 0.4–1.5)
GFR: 86 mL/min (ref >60.00)
Glucose, Bld: 96 mg/dL (ref 70–99)
POTASSIUM: 4 meq/L (ref 3.5–5.1)
Sodium: 138 mEq/L (ref 135–145)

## 2014-02-19 NOTE — Progress Notes (Signed)
Patient ID: Larry [name], male   DOB: Aug 25, 1955, 59 y.o.   MRN: [id number]     Reason for Appointment: ENDOCRINE consultation    History of Present Illness:   Problem 1: GOITER   The patient's thyroid enlargement was first discovered in 12/15 It was apparently having a cardiac nuclear scan and this showed uptake in the thyroid area leading to his getting a thyroid ultrasound He has never had any thyroid enlargement noted initially and no history of thyroid dysfunction He may have a little difficulty swallowing larg tablets but otherwise no symptoms of dysphagia or choking  His thyroid ultrasound showed a solid 3.4 cm left-sided nodule and a complex 1.9 cm right-sided nodule both in the interpolar regions along with relatively larger size of the thyroid. He is now referred here for further evaluation. Thyroid level was checked in December  Lab Results  Component Value Date   TSH 0.957 01/28/2014     Problem 2: HYPERTENSION and hypokalemia  He thinks he has had hypertension for the last 20 years or so followed by various primary care physicians. He thinks his blood pressure usually had been poorly controlled with readings as high as 180/120 Blood pressure was moderately increased when he was admitted to the hospital last month for chest pain He currently does not follow with a primary care physician but has seen a couple of different doctors before  He thinks he has had low potassium problems for several years and had been taking 2 tablets a day.  Previously would be getting with leg cramps but not now Since his potassium was low in the hospital he was given 6 tablets a day of potassium to take daily Also on 23rd of December he was started on 25 mg spironolactone He has had blood pressure checks at work and he thinks they are better at 120/80-140/80  During hospitalization he had random aldosterone/PRA levels done which were drawn in the evening hours in the supine position and  these showed the following: Plasma renin activity <0.1.  Aldosterone levels initially 9 and subsequently 18      Medication List       This list is accurate as of: 02/19/14  2:30 PM.  Always use your most recent med list.               acetaminophen 500 MG tablet  Commonly known as:  TYLENOL  Take 500 mg by mouth every 6 (six) hours as needed.     amLODipine 10 MG tablet  Commonly known as:  NORVASC  Take 1 tablet (10 mg total) by mouth daily.     aspirin 81 MG tablet  Take 81 mg by mouth daily.     hydrALAZINE 50 MG tablet  Commonly known as:  APRESOLINE  Take 1 tablet (50 mg total) by mouth 2 (two) times daily.     metoprolol succinate 50 MG 24 hr tablet  Commonly known as:  TOPROL-XL  Take 1 tablet (50 mg total) by mouth daily.     potassium chloride SA 20 MEQ tablet  Commonly known as:  K-DUR,KLOR-CON  Take 2 tablets (40 mEq total) by mouth 3 (three) times daily.     simvastatin 10 MG tablet  Commonly known as:  ZOCOR  Take 10 mg by mouth daily.     spironolactone 25 MG tablet  Commonly known as:  ALDACTONE  Take 1 tablet (25 mg total) by mouth daily.        Allergies:  No Known Allergies  Past Medical History  Diagnosis Date  . Hypertension   . AR (allergic rhinitis)   . Hyperlipidemia   . Rectal bleed 2001  . Thyroid nodule 01/2014  . Hypokalemia   . Hyperglycemia 01/2014    A1c 6.0    Past Surgical History  Procedure Laterality Date  . Esophagogastroduodenoscopy  2001  . Colonoscopy  2001  . Small bowel enteroscopy  2001    Family History  Problem Relation Age of Onset  . Cancer Mother   . Heart failure Mother   . Diabetes Mother   . Hypertension Father   . Hyperlipidemia Father   . Heart attack Father   . Sleep apnea Sister   . Hypertension Sister     Social History:  reports that he has been smoking.  He has never used smokeless tobacco. He reports that he drinks alcohol. He reports that he does not use illicit drugs.   Review  of Systems:  Review of Systems  Constitutional: Negative for malaise/fatigue.  HENT: Positive for congestion.   Respiratory: Negative for shortness of breath.   Cardiovascular: Negative for palpitations.  Gastrointestinal: Negative for diarrhea.  Musculoskeletal: Negative for myalgias.  Neurological: Negative for headaches.     No  history of Diabetes although his random glucose has been as high as 172 and A1c was higher than normal at 6.0   Obesity: He has had difficulty losing weight   He thinks he has had some sinus headaches recently.  He has history of sleep apnea .             Examination:   BP 152/82 mmHg  Pulse 79  Temp(Src) 98.4 F (36.9 C)  Resp 16  Ht  (1.778 m)  Wt 253 lb (114.76 kg)  BMI 36.30 kg/m2  SpO2 97%   General Appearance: pleasant, has moderate generalized obesity present no cushingoid features.          Eyes: No abnormal prominence or eyelid swelling. ENT: Oral mucosa and tongue are normal           Neck: The thyroid is enlarged bilaterally: Right-sided is about 2-1/2 times normal and nodular medially.  Left medial lobe slightly enlarged but larger softer lateral lobe palpable about 2-1/2 times normal.  No stridor. There is no lymphadenopathy .    Cardiovascular: Normal  heart sounds, no murmur Respiratory:  Lungs clear Abdomen shows no hepatosplenomegaly and no tenderness, no other mass. Neurological: REFLEXES: at biceps are normal.  No tremor present Skin: no rash      No pedal edema present  Assessment/Plan:  Probable HYPERALDOSTERONISM:  He has had hypertension and spontaneous hypokalemia which appears to be long-standing His aldosterone levels were checked in the hospital but they were done randomly in the evenings and probably and nonambulatory state The levels were inconsistent, initially 9 and subsequently 18 Also these were done in the presence of hypokalemia Although the level of 18 along with a low PRA is suggestive of  hyperaldosteronism it is not diagnostic  For now will have him recheck potassium level today To evaluate aldosterone secretion further will need to hold off on spironolactone for now He will have 24-hour urine potassium to document urinary potassium wasting Explained to patient how to collect 24-hour urine which he can do this weekend  If his potassium is normal will consider suppression testing with either saline infusion or high salt  for 3 days  For now we'll continue his antihypertensives unchanged  THYROID NODULES: He has a dominant left-sided nodule and discussed that he has at least a 5% chance of this being malignancy and needing surgical treatment.  Discussed implications of thyroid nodules and showed the patient done thyroid anatomical model what her thyroid nodule looks like.  Not clear if he may have some dysphagia related to the thyroid nodule    Explained to the patient in detail the procedure for the thyroid needle aspiration and this having an 80% diagnostic capability Will proceed with needle aspiration biopsy and discuss results when available   [facility],Aeon Kessner 02/19/2014  Addendum: Potassium normal, continue to hold Aldactone  Office Visit on 02/19/2014  Component Date Value Ref Range Status  . Sodium 02/19/2014 138  135 - 145 mEq/L Final  . Potassium 02/19/2014 4.0  3.5 - 5.1 mEq/L Final  . Chloride 02/19/2014 109  96 - 112 mEq/L Final  . CO2 02/19/2014 25  19 - 32 mEq/L Final  . Glucose, Bld 02/19/2014 96  70 - 99 mg/dL Final  . BUN 16/10/960401/07/2014 18  6 - 23 mg/dL Final  . Creatinine, Ser 02/19/2014 1.1  0.4 - 1.5 mg/dL Final  . Calcium 54/09/811901/07/2014 8.9  8.4 - 10.5 mg/dL Final  . GFR 14/78/295601/07/2014 86.00  >60.00 mL/min Final

## 2014-02-19 NOTE — Patient Instructions (Signed)
Stop Spironolactone   

## 2014-02-24 ENCOUNTER — Other Ambulatory Visit: Payer: 59

## 2014-02-25 LAB — CREATININE, URINE, 24 HOUR
CREATININE 24H UR: 2261.7 mg/(24.h) — AB (ref 1000.0–2000.0)
CREATININE UR: 107.7 mg/dL (ref 22.0–328.0)

## 2014-02-25 LAB — POTASSIUM, URINE, RANDOM: Potassium Urine: 94.2 mmol/L

## 2014-02-26 NOTE — Progress Notes (Signed)
Quick Note:  Please confirm with [facility] the potassium is a 24 hour value ______

## 2014-03-03 ENCOUNTER — Ambulatory Visit (INDEPENDENT_AMBULATORY_CARE_PROVIDER_SITE_OTHER): Payer: 59 | Admitting: Family Medicine

## 2014-03-03 VITALS — BP 128/84 | HR 81 | Temp 98.2°F | Resp 18 | Ht 69.5 in | Wt 244.6 lb

## 2014-03-03 DIAGNOSIS — Z8719 Personal history of other diseases of the digestive system: Secondary | ICD-10-CM

## 2014-03-03 DIAGNOSIS — E669 Obesity, unspecified: Secondary | ICD-10-CM

## 2014-03-03 DIAGNOSIS — K610 Anal abscess: Secondary | ICD-10-CM

## 2014-03-03 MED ORDER — CEFTRIAXONE SODIUM 1 G IJ SOLR
1.0000 g | Freq: Once | INTRAMUSCULAR | Status: AC
  Administered 2014-03-03: 1 g via INTRAMUSCULAR

## 2014-03-03 MED ORDER — AMOXICILLIN-POT CLAVULANATE 875-125 MG PO TABS: 1.0000 | ORAL_TABLET | Freq: Two times a day (BID) | ORAL | Status: DC

## 2014-03-03 MED ORDER — HYDROCODONE-ACETAMINOPHEN 5-325 MG PO TABS: 1.0000 | ORAL_TABLET | Freq: Three times a day (TID) | ORAL | Status: DC | PRN

## 2014-03-03 NOTE — Patient Instructions (Signed)
We are going to treat you for an abscess with Augmentin (oral antibiotic) and pain medication.  However be sure the pain medication does not make you constipated, use a stool softener or prune juice as needed.  Also do the warm tub soaks at least 2 x a day- better yet 3-4 x a day.    Please come and see me if you are not improving on Wednesday.  Let me know sooner if you are getting worse.    We also need to get you back in to see a GI doctor; I will help you schedule this.  You are overdue for a colonoscopy

## 2014-03-03 NOTE — Progress Notes (Signed)
[facility] [address] [place] [address] [phone]  Date:  03/03/2014   Name:  Larry [name]   DOB:  05/16/1955   MRN:  [id number]  PCP:  Marykay Lex, MD    Chief Complaint: Rectal Pain   History of Present Illness:  Larry [name] is a 59 y.o. very pleasant male patient who presents with the following:  Here today as a new patient.   He notes pain in his anal area that started with a "knot," he first noted it about 10 days ago.   It did seem to "pop" and bled just prior to his being seen at his PCP office on 1/11- he reports that he did not have an exam, but that his doctor through he likely had a hemorrhoid that burst.  He felt better for a few days.  However his sx have been getting worse again over the last 3 days, and now he cannot sit on the area and it is very painful.   He has tried prep H OTC which did help some.  He has also tried some advil.   No vomiting, no fever.  He does not have a history of hemorrhoids in the past He is seen by Dr. Lucianne Muss for a thyroid problem.  He did have a GI bleed in 2001- he reports that the cause was never really determined.  He has not had a colonoscopy since then; he does not recall who he saw from GI at that time  Patient Active Problem List   Diagnosis Date Noted  . Hyperglycemia 01/30/2014  . Thyroid nodule 01/30/2014  . Hypertension due to endocrine disorder 01/30/2014    Class: Question of  . Abnormal nuclear cardiac imaging test   . Chest pain at rest   . Hypokalemia 01/29/2014  . Chest pain with minimal risk for cardiac etiology 01/28/2014  . VT (ventricular tachycardia) 01/28/2014  . Unstable angina 01/28/2014  . Essential hypertension   . Hyperlipidemia     Past Medical History  Diagnosis Date  . Hypertension   . AR (allergic rhinitis)   . Hyperlipidemia   . Rectal bleed 2001  . Thyroid nodule 01/2014  . Hypokalemia   . Hyperglycemia 01/2014    A1c 6.0    Past Surgical History   Procedure Laterality Date  . Esophagogastroduodenoscopy  2001  . Colonoscopy  2001  . Small bowel enteroscopy  2001    History  Substance Use Topics  . Smoking status: Current Every Day Smoker -- 0.50 packs/day for 40 years    Types: Cigarettes  . Smokeless tobacco: Never Used  . Alcohol Use: 0.0 oz/week    0 Not specified per week     Comment: "I just drink on the weekend"    Family History  Problem Relation Age of Onset  . Cancer Mother   . Heart failure Mother   . Diabetes Mother   . Hypertension Father   . Hyperlipidemia Father   . Heart attack Father   . Sleep apnea Sister   . Hypertension Sister   . Thyroid disease Neg Hx     No Known Allergies  Medication list has been reviewed and updated.  Current Outpatient Prescriptions on File Prior to Visit  Medication Sig Dispense Refill  . amLODipine (NORVASC) 10 MG tablet Take 1 tablet (10 mg total) by mouth daily. 30 tablet 11  . aspirin 81 MG tablet Take 81 mg by mouth daily.    Marland Kitchen  hydrALAZINE (APRESOLINE) 50 MG tablet Take 1 tablet (50 mg total) by mouth 2 (two) times daily. 60 tablet 6  . metoprolol succinate (TOPROL-XL) 50 MG 24 hr tablet Take 1 tablet (50 mg total) by mouth daily. 30 tablet 11  . potassium chloride SA (K-DUR,KLOR-CON) 20 MEQ tablet Take 2 tablets (40 mEq total) by mouth 3 (three) times daily. 120 tablet 6  . simvastatin (ZOCOR) 10 MG tablet Take 10 mg by mouth daily.    Marland Kitchen. acetaminophen (TYLENOL) 500 MG tablet Take 500 mg by mouth every 6 (six) hours as needed.    Marland Kitchen. spironolactone (ALDACTONE) 25 MG tablet Take 1 tablet (25 mg total) by mouth daily. (Patient not taking: Reported on 03/03/2014) 30 tablet 6   No current facility-administered medications on file prior to visit.    Review of Systems:  As per HPI- otherwise negative.   Physical Examination: Filed Vitals:   03/03/14 1352  BP: 128/84  Pulse: 81  Temp: 98.2 F (36.8 C)  Resp: 18   Filed Vitals:   03/03/14 1352  Height: 5'  9.5" (1.765 m)  Weight: 244 lb 9.6 oz (110.95 kg)   Body mass index is 35.62 kg/(m^2). Ideal Body Weight: Weight in (lb) to have BMI = 25: 171.4  GEN: WDWN, NAD, Non-toxic, A & O x 3, obese, looks well HEENT: Atraumatic, Normocephalic. Neck supple. No masses, No LAD. Ears and Nose: No external deformity. CV: RRR, No M/G/R. No JVD. No thrill. No extra heart sounds. PULM: CTA B, no wheezes, crackles, rhonchi. No retractions. No resp. distress. No accessory muscle use. ABD: S, NT, ND. No rebound. No HSM.  Benign exam EXTR: No c/c/e NEURO Normal gait.  PSYCH: Normally interactive. Conversant. Not depressed or anxious appearing.  Calm demeanor.  Rectal exam: there is a tender area draining a small amount of purulent material at 9:00. It does not involve the sphincter   VC obtained.  Placed approx 2ml of 1% lidocaine to area above in prepraration for I and D.  When anesthesia allowed better exposure of the area noted a spontaneous drainage wound at 12:00; the wound bed appeared healthy and this area is no longer draining pus.  Did and I and D with 11 blade of area at 9:00 but no pus or clot expressed.  Incised parallel to sphincter, did not involve sphincter.  Performed DRE and did not appreciate any fistula.    Gave 1gm of rocephin IM Assessment and Plan: Perianal abscess - Plan: cefTRIAXone (ROCEPHIN) injection 1 g, amoxicillin-clavulanate (AUGMENTIN) 875-125 MG per tablet, HYDROcodone-acetaminophen (NORCO/VICODIN) 5-325 MG per tablet, Ambulatory referral to Gastroenterology  History of GI bleed - Plan: Ambulatory referral to Gastroenterology  Detailed discussion with pt.  He will use frequent sitz baths, and will take augmentin for his abscess.  Pain medication as needed for cautioned him not to allow constipation.  Plan follow-up in 48 hours if any pain remains, sooner if worse Will set him up to see GI- he has a history of a GI bleed of uncertain etiology and is overdue for a colonoscopy   Signed Abbe AmsterdamJessica Gray Maugeri, MD

## 2014-03-05 ENCOUNTER — Ambulatory Visit
Admission: RE | Admit: 2014-03-05 | Discharge: 2014-03-05 | Disposition: A | Discharge status: Home / Self Care | Payer: 59 | Source: Ambulatory Visit | Attending: Endocrinology | Admitting: Endocrinology

## 2014-03-05 ENCOUNTER — Encounter: Payer: Self-pay | Admitting: Internal Medicine

## 2014-03-05 ENCOUNTER — Other Ambulatory Visit (HOSPITAL_COMMUNITY)
Admission: RE | Admit: 2014-03-05 | Discharge: 2014-03-05 | Disposition: A | Discharge status: Home / Self Care | Payer: 59 | Source: Ambulatory Visit | Attending: Interventional Radiology | Admitting: Interventional Radiology

## 2014-03-05 DIAGNOSIS — E041 Nontoxic single thyroid nodule: Secondary | ICD-10-CM | POA: Diagnosis not present

## 2014-03-06 NOTE — Progress Notes (Signed)
Quick Note:  Please let patient know that the biopsy result is negative and no further action needed ______

## 2014-03-11 ENCOUNTER — Encounter: Payer: Self-pay | Admitting: *Deleted

## 2014-03-12 ENCOUNTER — Encounter: Payer: Self-pay | Admitting: Physician Assistant

## 2014-03-12 ENCOUNTER — Ambulatory Visit (INDEPENDENT_AMBULATORY_CARE_PROVIDER_SITE_OTHER): Payer: 59 | Admitting: Physician Assistant

## 2014-03-12 VITALS — BP 141/88 | HR 87 | Ht 69.5 in | Wt 256.3 lb

## 2014-03-12 DIAGNOSIS — I10 Essential (primary) hypertension: Secondary | ICD-10-CM

## 2014-03-12 DIAGNOSIS — E785 Hyperlipidemia, unspecified: Secondary | ICD-10-CM

## 2014-03-12 DIAGNOSIS — E669 Obesity, unspecified: Secondary | ICD-10-CM

## 2014-03-12 DIAGNOSIS — E876 Hypokalemia: Secondary | ICD-10-CM

## 2014-03-12 NOTE — Progress Notes (Signed)
Patient ID: Larry [name], male   DOB: November 20, 1955, 59 y.o.   MRN: [id number]    Date:  03/12/2014   ID:  Larry [name], DOB November 20, 1955, MRN [id number]  PCP:  Marykay LexHARDING, DAVID W, MD  Primary Cardiologist:  Herbie BaltimoreHarding  Chief Complaint  Patient presents with  . Follow-up    6 weeks, pt c/o dizziness and lightheaded     History of Present Illness: Larry [name] is a 59 y.o. obese male with no known CAD. Has HTN & HLD. He was seen in December  At the hospital with chest pain. Negative enzymes. Potassium was low and he had runs of NSVT. Potassium was supplemented. BP was high and medicines were adjusted.   He was noted to have NSVT. Echo showed normal EF. Myoview ok - low risk with no scar or ischemia. There was concern for endocrine etiology due to low potassium level and elevated BP.  Morning cortisol level was low.  He was noted to have multiple thyroid nodules on ultrasound - Discharged on Kdur 40 meq BID.  His potassium never noted to be in the normal range. Discharge K was 3.0  And follow-up potassium on January 6 was 4.0.   Patient is being followed by Dr. Lucianne MussKumar regarding his endocrine issues.   Had thyroid biopsy which was negative.     patient presents for follow-up. Reports having issues with a perianal abscess recently and continues to have some shortness of breath. He is randomly smoking at times  Does drink  About a pint of alcohol on the weekends.   He has a very  A per intensive job working for [employer].   He otherwise denies nausea, vomiting, fever, chest pain, shortness of breath, orthopnea, dizziness, PND, cough, congestion, abdominal pain, hematochezia, melena, lower extremity edema, claudication.  Wt Readings from Last 3 Encounters:  03/12/14 256 lb 4.8 oz (116.257 kg)  03/03/14 244 lb 9.6 oz (110.95 kg)  02/19/14 253 lb (114.76 kg)     Past Medical History  Diagnosis Date  . Hypertension   . AR (allergic rhinitis)   . Hyperlipidemia   . Rectal bleed 2001  . Thyroid nodule  01/2014  . Hypokalemia   . Hyperglycemia 01/2014    A1c 6.0    Current Outpatient Prescriptions  Medication Sig Dispense Refill  . amLODipine (NORVASC) 10 MG tablet Take 1 tablet (10 mg total) by mouth daily. 30 tablet 11  . amoxicillin-clavulanate (AUGMENTIN) 875-125 MG per tablet Take 1 tablet by mouth 2 (two) times daily. 20 tablet 0  . aspirin 81 MG tablet Take 81 mg by mouth daily.    . hydrALAZINE (APRESOLINE) 50 MG tablet Take 1 tablet (50 mg total) by mouth 2 (two) times daily. 60 tablet 6  . HYDROcodone-acetaminophen (NORCO/VICODIN) 5-325 MG per tablet Take 1 tablet by mouth every 8 (eight) hours as needed. 20 tablet 0  . metoprolol succinate (TOPROL-XL) 50 MG 24 hr tablet Take 1 tablet (50 mg total) by mouth daily. 30 tablet 11  . potassium chloride SA (K-DUR,KLOR-CON) 20 MEQ tablet Take 2 tablets (40 mEq total) by mouth 3 (three) times daily. 120 tablet 6  . simvastatin (ZOCOR) 10 MG tablet Take 10 mg by mouth daily.    Marland Kitchen. spironolactone (ALDACTONE) 25 MG tablet Take 1 tablet (25 mg total) by mouth daily. 30 tablet 6  . Witch Hazel (PREPARATION H EX) Apply topically.     No current facility-administered medications for this visit.    Allergies:  No Known Allergies  Social History:  The patient  reports that he has been smoking Cigarettes.  He has a 20 pack-year smoking history. He has never used smokeless tobacco. He reports that he drinks alcohol. He reports that he does not use illicit drugs.   Family history:   Family History  Problem Relation Age of Onset  . Cancer Mother   . Heart failure Mother   . Diabetes Mother   . Hypertension Father   . Hyperlipidemia Father   . Heart attack Father   . Sleep apnea Sister   . Hypertension Sister   . Thyroid disease Neg Hx     ROS:  Please see the history of present illness.  All other systems reviewed and negative.   PHYSICAL EXAM: VS:  BP 141/88 mmHg  Pulse 87  Ht 5' 9.5" (1.765 m)  Wt 256 lb 4.8 oz (116.257 kg)   BMI 37.32 kg/m2 Obese, well developed, in no acute distress HEENT: Pupils are equal round react to light accommodation extraocular movements are intact.  Neck: no JVDNo cervical lymphadenopathy. Cardiac: Regular rate and rhythm without murmurs rubs or gallops. Lungs:  clear to auscultation bilaterally, no wheezing, rhonchi or rales Abd: soft, nontender, positive bowel sounds all quadrants, no hepatosplenomegaly Ext: trace lower extremity edema.  2+ radial and dorsalis pedis pulses. Skin: warm and dry Neuro:  Grossly normal    ASSESSMENT AND PLAN:  Problem List Items Addressed This Visit    Obesity     The patient be referred to a registered dietitian to help with weight loss.      Hypokalemia (Chronic)     Within normal limits  As of January 6.      Hyperlipidemia (Chronic)     Continue statin  Lipid Panel     Component Value Date/Time   CHOL 189 02/04/2014 0955   TRIG 203.0* 02/04/2014 0955   HDL 41.90 02/04/2014 0955   CHOLHDL 5 02/04/2014 0955   VLDL 40.6* 02/04/2014 0955   LDLDIRECT 112.0 02/04/2014 0955          Essential hypertension - Primary (Chronic)     Blood pressure is better.    The patient reports his blood pressure when he checks it at home has been in the 110's. Continue current medications          follow-up in 6 months with Dr. Herbie Baltimore

## 2014-03-12 NOTE — Patient Instructions (Addendum)
Your physician recommends that you schedule a follow-up appointment in:3 months with Dr.Harding.  You have been referred to a dietitian, Karenann Caimy Hager.

## 2014-03-12 NOTE — Assessment & Plan Note (Signed)
Within normal limits  As of January 6.

## 2014-03-12 NOTE — Assessment & Plan Note (Addendum)
Blood pressure is better.    The patient reports his blood pressure when he checks it at home has been in the 110's. Continue current medications

## 2014-03-12 NOTE — Assessment & Plan Note (Signed)
Continue statin  Lipid Panel     Component Value Date/Time   CHOL 189 02/04/2014 0955   TRIG 203.0* 02/04/2014 0955   HDL 41.90 02/04/2014 0955   CHOLHDL 5 02/04/2014 0955   VLDL 40.6* 02/04/2014 0955   LDLDIRECT 112.0 02/04/2014 0955

## 2014-03-12 NOTE — Assessment & Plan Note (Signed)
The patient be referred to a registered dietitian to help with weight loss.

## 2014-03-13 ENCOUNTER — Encounter: Payer: Self-pay | Admitting: Endocrinology

## 2014-03-13 ENCOUNTER — Ambulatory Visit: Payer: 59 | Admitting: Endocrinology

## 2014-03-13 ENCOUNTER — Ambulatory Visit (INDEPENDENT_AMBULATORY_CARE_PROVIDER_SITE_OTHER): Payer: 59 | Admitting: Endocrinology

## 2014-03-13 VITALS — BP 146/94 | HR 87 | Temp 98.0°F | Resp 16 | Ht 69.0 in | Wt 257.6 lb

## 2014-03-13 DIAGNOSIS — E041 Nontoxic single thyroid nodule: Secondary | ICD-10-CM

## 2014-03-13 DIAGNOSIS — E269 Hyperaldosteronism, unspecified: Secondary | ICD-10-CM

## 2014-03-13 NOTE — Patient Instructions (Addendum)
Stop Spironolactone  On Feb 12 thru 14th have a high salt diet and add salt tablets 2 at each meal and collect urine urine on 14th  Take 3 pills twice daily of potassium

## 2014-03-13 NOTE — Progress Notes (Signed)
Patient ID: Larry [name], male   DOB: 12-27-1955, 59 y.o.   MRN: [id number]     Reason for Appointment: Follow-up of endocrine problems    History of Present Illness:   Problem 1: Thyroid nodule   The patient's thyroid enlargement was first discovered in 12/15 His cardiac nuclear scan this showed uptake in the thyroid area leading to his getting a thyroid ultrasound He may have a little difficulty swallowing larg tablets but otherwise no symptoms of dysphagia or choking  His thyroid ultrasound showed a solid 3.4 cm left-sided nodule and a complex 1.9 cm right-sided nodule both in the interpolar regions along with relatively larger size of the thyroid.  Needle aspiration biopsy of the dominant nodule showed benign cells although the aspiration yielded scanty cells  Lab Results  Component Value Date   TSH 0.957 01/28/2014     Problem 2: HYPERTENSION and hypokalemia  He has had hypertension for the last 20 years or so followed by various primary care physicians. Previously blood pressure  had been poorly controlled with readings as high as 180/120 His blood pressure tends to be moderately elevated although he thinks that this goes up when he is smoking He says his blood pressure at work is about 120/80-140/80  Apparently he has had low potassium levels for several years and had persistent hypokalemia, was given 6 tablets a day of KCl on his hospital discharge.  During hospitalization he had random aldosterone/PRA levels done which were drawn in the evening hours in the supine position and these showed the following: Plasma renin activity <0.1.  Aldosterone levels initially 9 and subsequently 18  He was taken off Aldactone and 24-hour urine potassium was 94 although creatinine in the collection was higher than normal. He was told to leave off the Aldactone for further evaluation but he has gone back on it since he did the test on 02/23/14 Most recent potassium was 4.0       Medication List       This list is accurate as of: 03/13/14  3:21 PM.  Always use your most recent med list.               amLODipine 10 MG tablet  Commonly known as:  NORVASC  Take 1 tablet (10 mg total) by mouth daily.     amoxicillin-clavulanate 875-125 MG per tablet  Commonly known as:  AUGMENTIN  Take 1 tablet by mouth 2 (two) times daily.     aspirin 81 MG tablet  Take 81 mg by mouth daily.     hydrALAZINE 50 MG tablet  Commonly known as:  APRESOLINE  Take 1 tablet (50 mg total) by mouth 2 (two) times daily.     HYDROcodone-acetaminophen 5-325 MG per tablet  Commonly known as:  NORCO/VICODIN  Take 1 tablet by mouth every 8 (eight) hours as needed.     metoprolol succinate 50 MG 24 hr tablet  Commonly known as:  TOPROL-XL  Take 1 tablet (50 mg total) by mouth daily.     potassium chloride SA 20 MEQ tablet  Commonly known as:  K-DUR,KLOR-CON  Take 2 tablets (40 mEq total) by mouth 3 (three) times daily.     PREPARATION H EX  Apply topically.     simvastatin 10 MG tablet  Commonly known as:  ZOCOR  Take 10 mg by mouth daily.     spironolactone 25 MG tablet  Commonly known as:  ALDACTONE  Take 1 tablet (25 mg total) by  mouth daily.        Allergies: No Known Allergies  Past Medical History  Diagnosis Date  . Hypertension   . AR (allergic rhinitis)   . Hyperlipidemia   . Rectal bleed 2001  . Thyroid nodule 01/2014  . Hypokalemia   . Hyperglycemia 01/2014    A1c 6.0    Past Surgical History  Procedure Laterality Date  . Esophagogastroduodenoscopy  2001  . Colonoscopy  2001  . Small bowel enteroscopy  2001    Family History  Problem Relation Age of Onset  . Cancer Mother   . Heart failure Mother   . Diabetes Mother   . Hypertension Father   . Hyperlipidemia Father   . Heart attack Father   . Sleep apnea Sister   . Hypertension Sister   . Thyroid disease Neg Hx     Social History:  reports that he has been smoking Cigarettes.  He has  a 20 pack-year smoking history. He has never used smokeless tobacco. He reports that he drinks alcohol. He reports that he does not use illicit drugs.   Review of Systems:  ROS   No  history of Diabetes although his random glucose has been as high as 172 and A1c was higher than normal at 6.0   He has history of sleep apnea .           No recent chest pain   Examination:   BP 146/94 mmHg  Pulse 87  Temp(Src) 98 F (36.7 C)  Resp 16  Ht 5\' 9"  (1.753 m)  Wt 257 lb 9.6 oz (116.847 kg)  BMI 38.02 kg/m2  SpO2 95%   No pedal edema present  Assessment/Plan:  Probable HYPERALDOSTERONISM:  He has had hypertension and spontaneous hypokalemia along with significantly high 24-hour urine potassium Since his history is highly suggestive of hyperaldosteronism will have him do salt suppression test He does not want to take any time off from work to do the saline infusion test at the short stay center To evaluate aldosterone secretion further will need to hold off on spironolactone for now  He agrees to do salt loading orally for 3 days prior to a 24-hour urine collection of 24-hour aldosterone levels Given him detailed instructions Explained to patient how to collect 24-hour urine again Meanwhile will continue potassium supplements  For now will also continue his antihypertensives unchanged He will try to keep a record of his blood pressure readings at work Also discussed with him that he may need radiological studies if hyperaldosteronism diagnosed  THYROID NODULE is benign although his cytology was showing scant cells Will need to do a follow-up ultrasound in June  Patient Instructions  Stop Spironolactone  On Feb 12 thru 14th have a high salt diet and add salt tablets 2 at each meal and collect urine urine on 14th  Take 3 pills twice daily of potassium     Larry [name] 03/13/2014    No visits with results within 1 Day(s) from this visit. Latest known visit with results  is:  Appointment on 02/24/2014  Component Date Value Ref Range Status  . Creatinine, Ur 02/24/2014 107.7  22.0 - 328.0 mg/dL Final  . Creatinine, 14N24H Ur 02/24/2014 2261.7* 1000.0 - 2000.0 mg/24 hr Final  . Potassium Urine Timed 02/24/2014 94.2  Not Estab. mmol/L Final

## 2014-04-17 ENCOUNTER — Ambulatory Visit: Payer: 59 | Admitting: Endocrinology

## 2014-04-18 ENCOUNTER — Other Ambulatory Visit: Payer: Self-pay | Admitting: *Deleted

## 2014-04-18 MED ORDER — HYDRALAZINE HCL 50 MG PO TABS: 50.0000 mg | ORAL_TABLET | Freq: Two times a day (BID) | ORAL | Status: DC

## 2014-04-18 NOTE — Telephone Encounter (Signed)
Rx(s) sent to pharmacy electronically.  

## 2014-04-21 ENCOUNTER — Other Ambulatory Visit: Payer: Self-pay | Admitting: *Deleted

## 2014-04-21 ENCOUNTER — Telehealth: Payer: Self-pay | Admitting: *Deleted

## 2014-04-21 DIAGNOSIS — E876 Hypokalemia: Secondary | ICD-10-CM

## 2014-04-21 MED ORDER — METOPROLOL SUCCINATE ER 50 MG PO TB24: 50.0000 mg | ORAL_TABLET | Freq: Every day | ORAL | Status: DC

## 2014-04-21 MED ORDER — AMLODIPINE BESYLATE 10 MG PO TABS: 10.0000 mg | ORAL_TABLET | Freq: Every day | ORAL | Status: DC

## 2014-04-21 MED ORDER — SPIRONOLACTONE 25 MG PO TABS: 25.0000 mg | ORAL_TABLET | Freq: Every day | ORAL | Status: DC

## 2014-04-21 NOTE — Telephone Encounter (Signed)
called to make sure they had the refills on file...meds Metoprolol 50 mg and Amlodipine 10 mg were filled 01/30/14 with 11 refills.Marland Kitchen. [facility] [place] [facility].Marland Kitchen..Marland Kitchen

## 2014-04-21 NOTE — Telephone Encounter (Signed)
ERX sent to pharmacy 

## 2014-04-22 ENCOUNTER — Ambulatory Visit: Payer: 59 | Admitting: Internal Medicine

## 2014-06-12 ENCOUNTER — Ambulatory Visit (INDEPENDENT_AMBULATORY_CARE_PROVIDER_SITE_OTHER): Payer: 59 | Admitting: Cardiology

## 2014-06-12 ENCOUNTER — Encounter: Payer: Self-pay | Admitting: Cardiology

## 2014-06-12 VITALS — BP 140/80 | Ht 69.0 in | Wt 251.6 lb

## 2014-06-12 DIAGNOSIS — E785 Hyperlipidemia, unspecified: Secondary | ICD-10-CM | POA: Diagnosis not present

## 2014-06-12 DIAGNOSIS — I152 Hypertension secondary to endocrine disorders: Secondary | ICD-10-CM

## 2014-06-12 DIAGNOSIS — E669 Obesity, unspecified: Secondary | ICD-10-CM

## 2014-06-12 DIAGNOSIS — I10 Essential (primary) hypertension: Secondary | ICD-10-CM | POA: Diagnosis not present

## 2014-06-12 DIAGNOSIS — E876 Hypokalemia: Secondary | ICD-10-CM

## 2014-06-12 NOTE — Progress Notes (Signed)
PCP: Marykay LexHARDING, [place] Nase W, MD  Clinic Note: Chief Complaint  Patient presents with  . Follow-up    no concern no pain or swelling in legs  . Hypertension    HPI: Larry [name] is a 59 y.o. male with a PMH below who presents today for follow-up of difficult to control hypertension with hypokalemia - process of being evaluated by endocrinology for hyperaldosteronism.  No history of CAD. He may have had a stress test or echocardiogram in the past by Dr. Algie CofferKadakia, but no records are available.  In December 2015, [facility] L sided CP & PSVT.  He had a negative Myoview. Because of his hypokalemia, he was also been seen by endocrinology during the hospital stay, however this did not happen and he was now finally seen in followup as an outpatient.  Saw Norma FredricksonLori Gerhardt 01/2014 forHospital f/u  CP. Runs of PSVT (with HypoKalemia).  Saw Wilburt FinlayBryan Hager in Jan 2016.  He is working on trying [facility].  Past Medical History  Diagnosis Date  . Hypertension     Difficult to control - ? Secondary to Hyperaldosteronism.  . AR (allergic rhinitis)   . Hyperlipidemia   . Rectal bleed 2001  . Thyroid nodule 01/2014  . Hypokalemia   . Hyperglycemia 01/2014    A1c 6.0  . Hyperaldosteronism     Prior Cardiac Evaluation and Past Surgical History: Past Surgical History  Procedure Laterality Date  . Esophagogastroduodenoscopy  2001  . Colonoscopy  2001  . Small bowel enteroscopy  2001  . Nm myoview ltd  December 2015    EF 50%, No ischemia or infarction   Interval History: he presents today feeling quite well without any major complaints. He basically said that since his blood pressures done under control from hospitalization he has been feeling his duties he is felt in a long time. He is not having any chest discomfort or dyspnea. He is not feeling fatigued or achy. No headaches. He is not had any chest tightness, pressure or dyspnea at rest or  exertion. No PND, orthopnea or edema.  No palpitations, lightheadedness, dizziness, weakness or syncope/near syncope. No TIA/amaurosis fugax symptoms. No melena, hematochezia, hematuria, or epstaxis. No claudication.  ROS: A comprehensive was performed. Review of Systems  Constitutional: Negative for fever and malaise/fatigue.  HENT: Negative for congestion.   Respiratory: Negative.   Gastrointestinal: Positive for abdominal pain (He notes a fullness in the left upper quadrant he also has a warmth localized swelling area. He thinks may be a tic embedded).  Skin: Negative for rash.  Neurological: Negative for weakness.  Endo/Heme/Allergies: Does not bruise/bleed easily.  Psychiatric/Behavioral: Negative for depression. The patient is not nervous/anxious.   All other systems reviewed and are negative.   Current Outpatient Prescriptions on File Prior to Visit  Medication Sig Dispense Refill  . amLODipine (NORVASC) 10 MG tablet Take 1 tablet (10 mg total) by mouth daily. 90 tablet 2  . aspirin 81 MG tablet Take 81 mg by mouth daily.    . potassium chloride SA (K-DUR,KLOR-CON) 20 MEQ tablet Take 2 tablets (40 mEq total) by mouth 3 (three) times daily. 120 tablet 6  . spironolactone (ALDACTONE) 25 MG tablet Take 1 tablet (25 mg total) by mouth daily. 90 tablet 1   No current facility-administered medications on file prior to visit.   No Known Allergies  History  Substance Use Topics  . Smoking status:  Current Every Day Smoker -- 0.50 packs/day for 40 years    Types: Cigarettes  . Smokeless tobacco: Never Used  . Alcohol Use: 0.0 oz/week    0 Standard drinks or equivalent per week     Comment: "I just drink on the weekend"   family history includes Cancer in his mother; Diabetes in his mother; Heart attack in his father; Heart failure in his mother; Hyperlipidemia in his father; Hypertension in his father and sister; Sleep apnea in his sister. There is no history of Thyroid  disease.  Wt Readings from Last 3 Encounters:  06/12/14 251 lb 9.6 oz (114.125 kg)  03/13/14 257 lb 9.6 oz (116.847 kg)  03/12/14 256 lb 4.8 oz (116.257 kg)    PHYSICAL EXAM BP 140/80 mmHg  Ht  (1.753 m)  Wt 251 lb 9.6 oz (114.125 kg)  BMI 37.14 kg/m2 General appearance: alert, cooperative, appears stated age, no distress and moderately obese; well-nourished and well-groomed; normal mood and affect HEENT: [place]/AT, EOMI, MMM, anicteric sclera Neck: no adenopathy, no carotid bruit and no JVD Lungs: clear to auscultation bilaterally, normal percussion bilaterally and non-labored Heart: regular rate and rhythm, S1& S2 normal, no murmur, click, rub or gallop; nondisplaced PMI Abdomen: soft, non-tender; bowel sounds normal; no masses,  no organomegaly; there is a slight fullness in the left upper quadrant/left of the epigastrium. There is also a small roughly 1-2 cm diameter knot that is somewhat tender Extremities: extremities normal, atraumatic, no cyanosis, and edema  Pulses: 2+ and symmetric;  Neurologic: Mental status: Alert, oriented, thought content appropriate; Cranial nerves: normal (II-XII grossly intact)    Adult ECG Report  Rate: 84 ;  Rhythm: normal sinus rhythm and Possible left atrial enlargement  Narrative Interpretation: normal EKG normal axis, intervals and durations. Normal voltage.  Recent Labs:  Lab Results  Component Value Date   K 4.0 02/19/2014   Lab Results  Component Value Date   CREATININE 1.1 02/19/2014   Lab Results  Component Value Date   CHOL 189 02/04/2014   HDL 41.90 02/04/2014   LDLDIRECT 112.0 02/04/2014   TRIG 203.0* 02/04/2014   CHOLHDL 5 02/04/2014    ASSESSMENT / PLAN: Problem List Items Addressed This Visit    Essential hypertension - Primary (Chronic)    Actually relatively stable. I am not sure whether or not he is actually taking spironolactone or not. Supposedly he is supposed to be holding some medicines in order to have lab  tests done ordered by Dr. Lucianne Muss. He had rescheduled his appointment for labs and some nausea with these to be taken. Eventually he will be taking spironolactone in addition to hydralazine and amlodipine with his metoprolol. His blood pressures pretty well controlled. Once we know that he is not to have to stop medications and probably further titrate up. I would prefer to use an ACE inhibitor or ARB over hydralazine, we can't do that if he is still undergoing evaluation for hyperaldosteronism.      Relevant Medications   hydrALAZINE (APRESOLINE) 50 MG tablet   Hyperlipidemia (Chronic)    On simvastatin. Lab Results  Component Value Date   CHOL 189 02/04/2014   HDL 41.90 02/04/2014   LDLDIRECT 112.0 02/04/2014   TRIG 203.0* 02/04/2014   CHOLHDL 5 02/04/2014         Relevant Medications   hydrALAZINE (APRESOLINE) 50 MG tablet   Hypertension due to endocrine disorder (Chronic)    Evaluated by Dr. Lucianne Muss. He'll eventually need to be back  on spironolactone as indicated.      Relevant Medications   hydrALAZINE (APRESOLINE) 50 MG tablet   Hypokalemia (Chronic)    Has not been rechecked. This probably needs to be monitored by his endocrinologist. I have recommended he contact Dr. Kathi Der office again to reschedule these labs and to be followed back up again.      Obesity (Chronic)    When he saw Huey Bienenstock, PA he was referred to a dietitian. The patient understands the need to lose weight with diet and exercise. We have discussed specific strategies for this.           Followup: 4-5 months    Audine Mangione, Piedad Climes, M.D., M.S. Interventional Cardiologist   Pager # [phone]

## 2014-06-12 NOTE — Patient Instructions (Signed)
CONTACT DR Lucianne MussKUMAR 'S OFFICE--PH [phone] IN REGARDS SODIUM LEVEL AND MEDICATIONS  YOUR HEART IS STABLE.   Your physician wants you to follow-up in 4-5 MONTHS DR HARDING.  You will receive a reminder letter in the mail two months in advance. If you don't receive a letter, please call our office to schedule the follow-up appointment.

## 2014-06-14 ENCOUNTER — Encounter: Payer: Self-pay | Admitting: Cardiology

## 2014-06-14 DIAGNOSIS — E269 Hyperaldosteronism, unspecified: Secondary | ICD-10-CM | POA: Insufficient documentation

## 2014-06-14 NOTE — Assessment & Plan Note (Signed)
On simvastatin. Lab Results  Component Value Date   CHOL 189 02/04/2014   HDL 41.90 02/04/2014   LDLDIRECT 112.0 02/04/2014   TRIG 203.0* 02/04/2014   CHOLHDL 5 02/04/2014

## 2014-06-14 NOTE — Assessment & Plan Note (Signed)
When he saw Huey BienenstockBrian Hager, PA he was referred to a dietitian. The patient understands the need to lose weight with diet and exercise. We have discussed specific strategies for this.

## 2014-06-14 NOTE — Assessment & Plan Note (Signed)
Actually relatively stable. I am not sure whether or not he is actually taking spironolactone or not. Supposedly he is supposed to be holding some medicines in order to have lab tests done ordered by Dr. Lucianne MussKumar. He had rescheduled his appointment for labs and some nausea with these to be taken. Eventually he will be taking spironolactone in addition to hydralazine and amlodipine with his metoprolol. His blood pressures pretty well controlled. Once we know that he is not to have to stop medications and probably further titrate up. I would prefer to use an ACE inhibitor or ARB over hydralazine, we can't do that if he is still undergoing evaluation for hyperaldosteronism.

## 2014-06-14 NOTE — Assessment & Plan Note (Signed)
Evaluated by Dr. Lucianne MussKumar. He'll eventually need to be back on spironolactone as indicated.

## 2014-06-14 NOTE — Assessment & Plan Note (Signed)
Has not been rechecked. This probably needs to be monitored by his endocrinologist. I have recommended he contact Dr. Kathi DerMoore's office again to reschedule these labs and to be followed back up again.

## 2014-08-02 ENCOUNTER — Other Ambulatory Visit: Payer: Self-pay | Admitting: Cardiology

## 2014-08-04 NOTE — Telephone Encounter (Signed)
E-sent to pharmacy  Needs appointment  In 10/2014

## 2015-02-17 ENCOUNTER — Other Ambulatory Visit: Payer: Self-pay

## 2015-02-17 ENCOUNTER — Other Ambulatory Visit: Payer: Self-pay | Admitting: Cardiology

## 2015-02-17 DIAGNOSIS — E876 Hypokalemia: Secondary | ICD-10-CM

## 2015-02-17 MED ORDER — POTASSIUM CHLORIDE CRYS ER 20 MEQ PO TBCR: 40.0000 meq | EXTENDED_RELEASE_TABLET | Freq: Three times a day (TID) | ORAL | Status: DC

## 2015-02-17 NOTE — Telephone Encounter (Signed)
Rx(s) sent to pharmacy electronically.  

## 2015-03-07 ENCOUNTER — Other Ambulatory Visit: Payer: Self-pay | Admitting: Nurse Practitioner

## 2015-03-22 IMAGING — US US THYROID BIOPSY
1 series · 14 of 14 positions shown · non-contrast
Comparison: Thyroid ultrasound on 01/29/2014

CLINICAL DATA: Dominant solid left thyroid nodule measuring 3 cm.

EXAM:
ULTRASOUND GUIDED NEEDLE ASPIRATE BIOPSY OF THE THYROID GLAND

[Series 1: us thyroid biopsy · 0.08mm/px · 14 acquisitions, 14 frames shown]
[im 1/14]
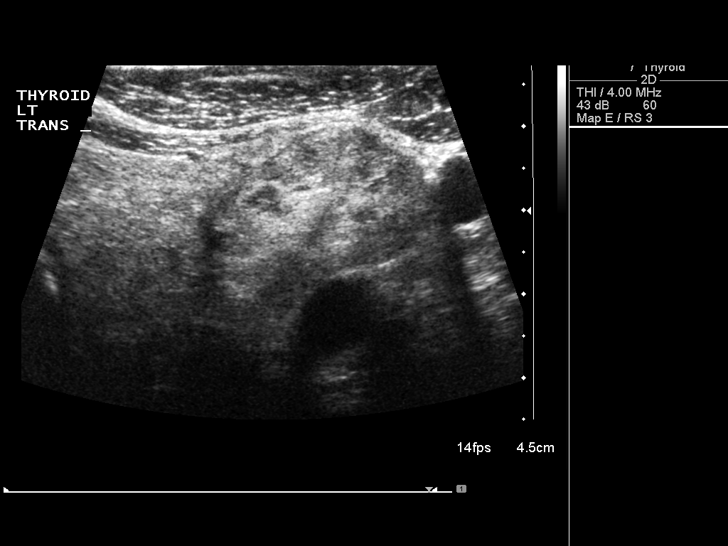
[im 2/14]
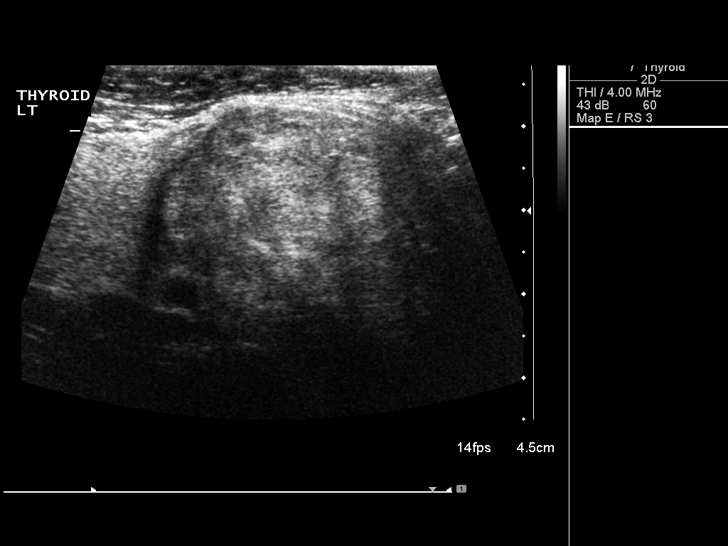
[im 3/14]
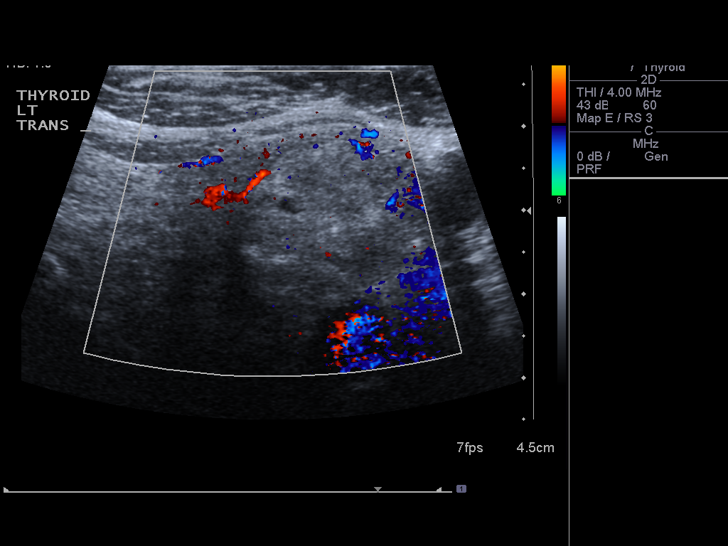
[im 4/14]
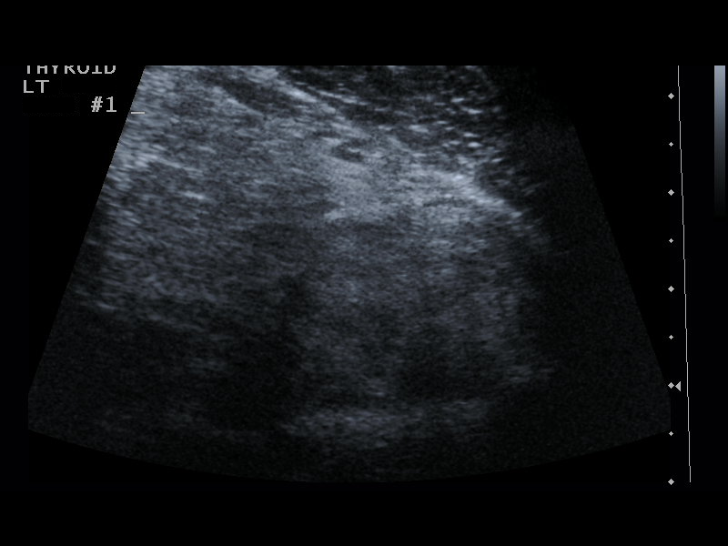
[im 5/14]
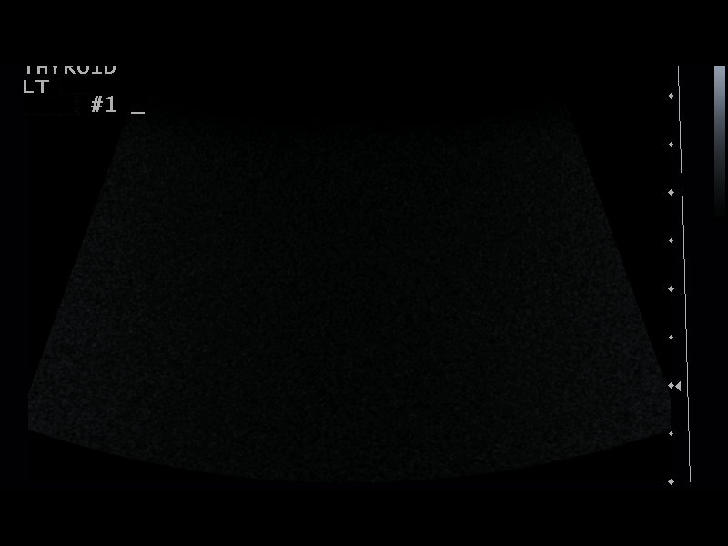
[im 6/14]
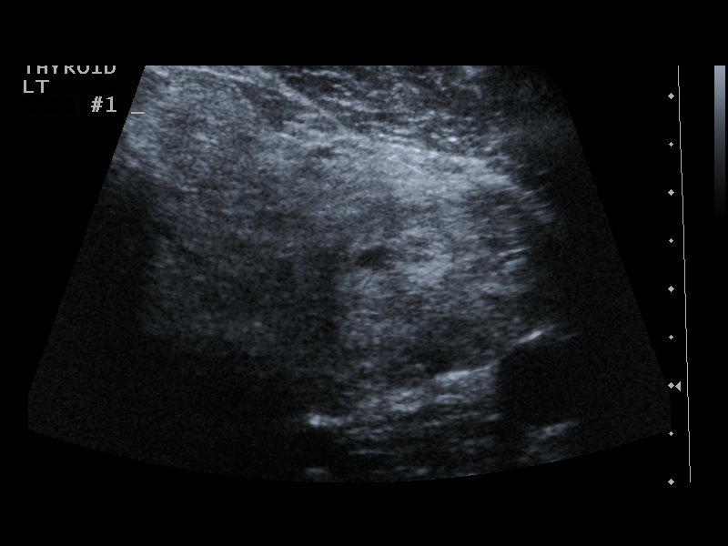
[im 7/14]
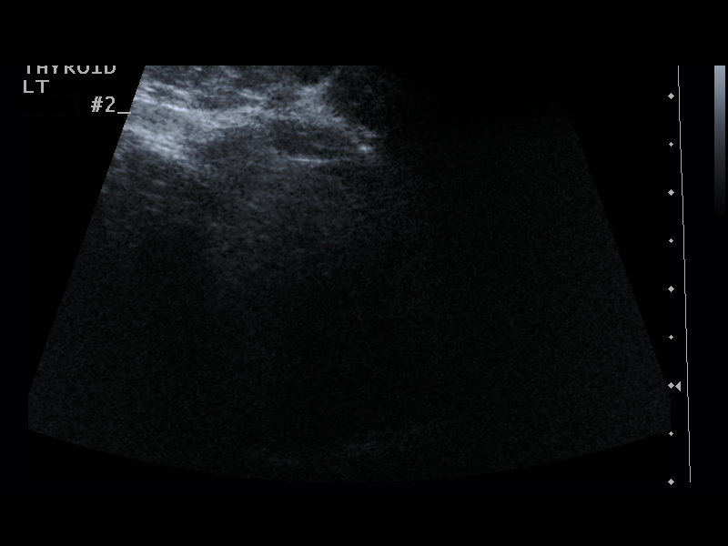
[im 8/14]
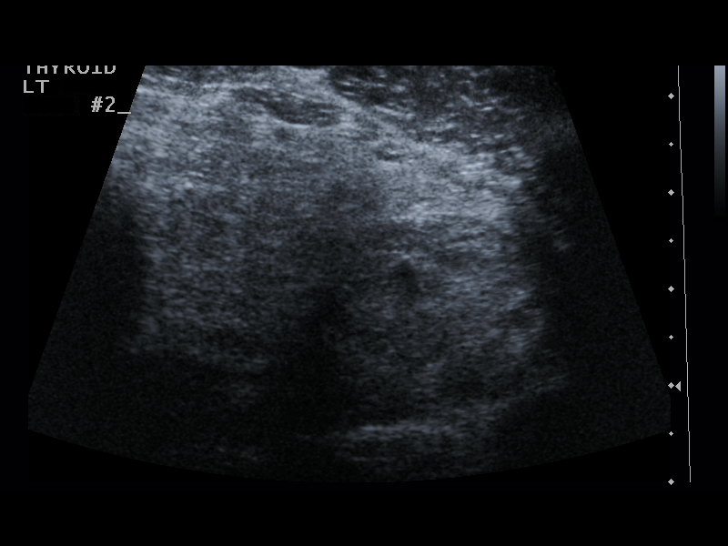
[im 9/14]
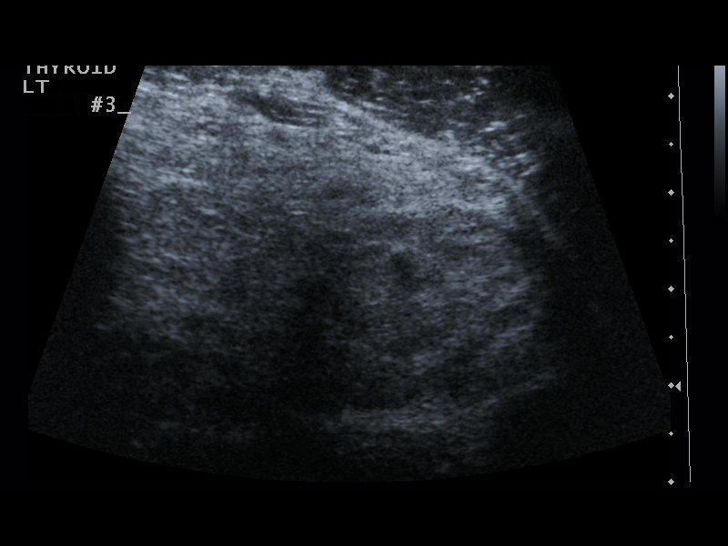
[im 10/14]
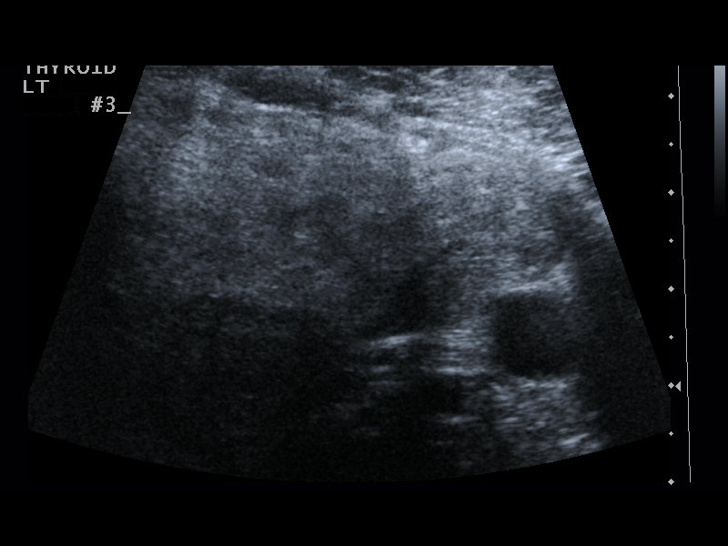
[im 11/14]
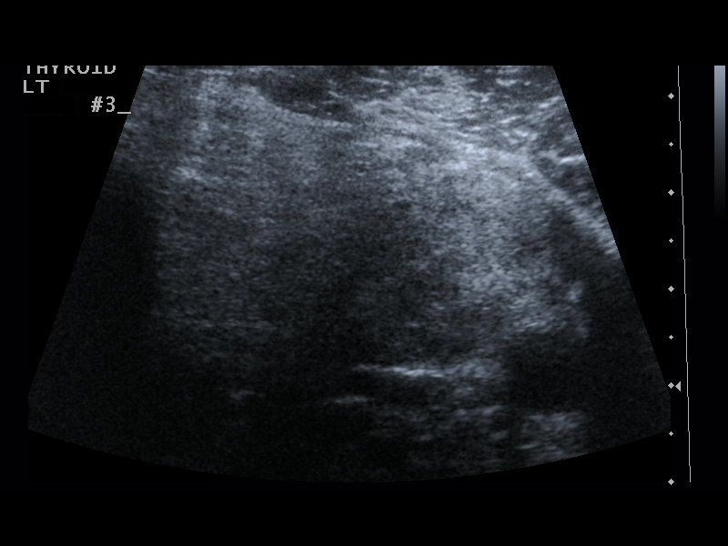
[im 12/14]
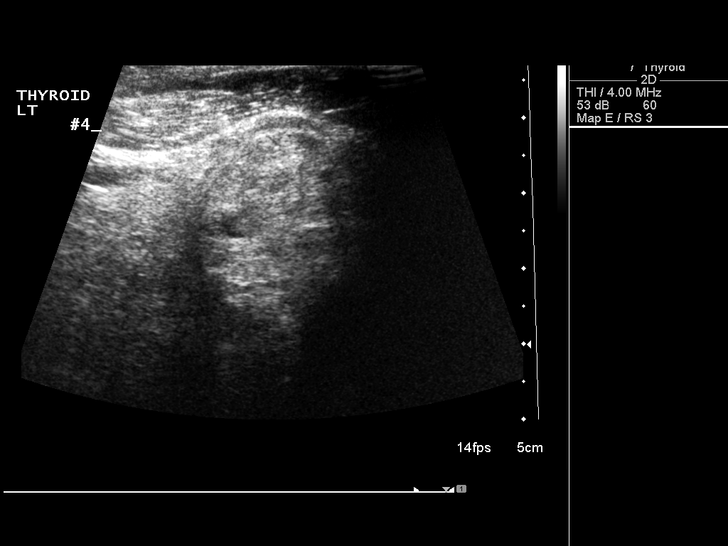
[im 13/14]
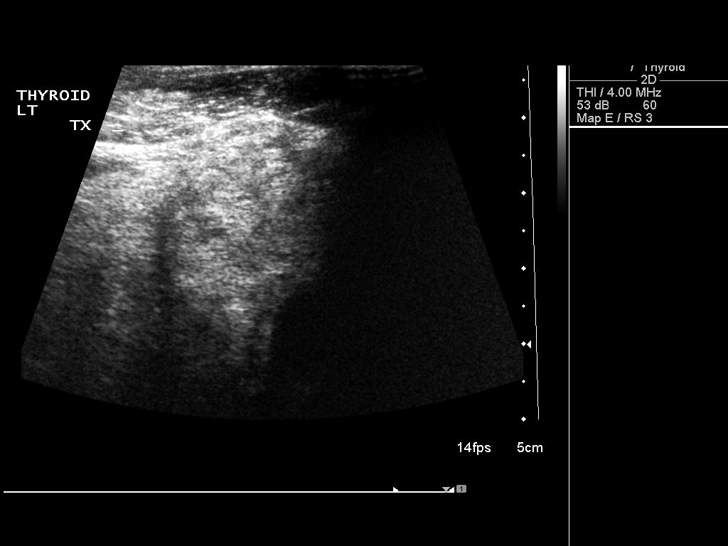
[im 14/14]
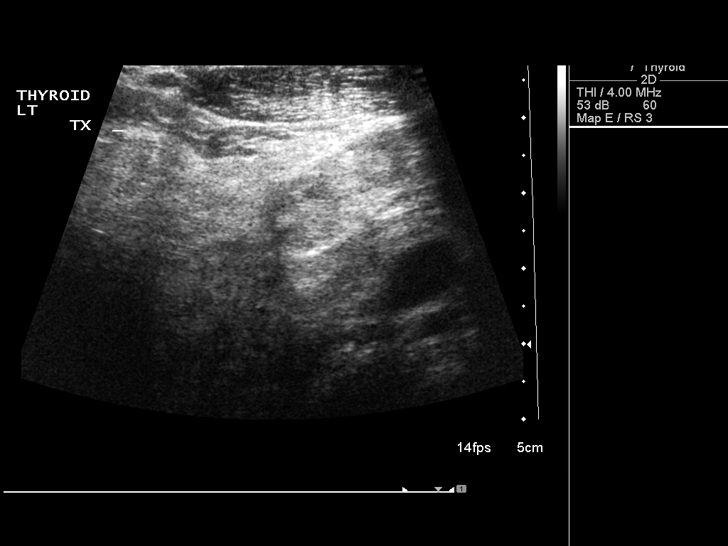

[14 of 14 positions shown; findings below may reference images not displayed]

PROCEDURE:
Thyroid biopsy was thoroughly discussed with the patient and
questions were answered. The benefits, risks, alternatives, and
complications were also discussed. The patient understands and
wishes to proceed with the procedure. Written consent was obtained.

Ultrasound was performed to localize and mark an adequate site for
the biopsy. The patient was then prepped and draped in a normal
sterile fashion. Local anesthesia was provided with 1% lidocaine.
Using direct ultrasound guidance, 4 passes were made using 25 gauge
needles into the nodule within the left lobe of the thyroid.
Ultrasound was used to confirm needle placements on all occasions.
Specimens were sent to Pathology for analysis.

COMPLICATIONS:
None
FINDINGS: Needle aspirate samples were obtained in different portions of the
dominant solid left thyroid nodule.
IMPRESSION: Ultrasound guided needle aspirate biopsy performed of the dominant
left thyroid nodule.

## 2015-03-24 ENCOUNTER — Other Ambulatory Visit: Payer: Self-pay | Admitting: Cardiology

## 2015-04-02 ENCOUNTER — Other Ambulatory Visit: Payer: Self-pay | Admitting: Cardiology

## 2015-04-03 NOTE — Telephone Encounter (Signed)
Rx(s) sent to pharmacy electronically.  

## 2015-05-06 ENCOUNTER — Telehealth: Payer: Self-pay | Admitting: Cardiology

## 2015-05-06 NOTE — Telephone Encounter (Signed)
Pt has not had labs checked in over 1 year.  At that time it was questionable if he was taking spironolactone.  Pt has a history of hypokalemia so will give okay to fill.  He was given a refill in January and told to make a follow up appt.  Spoke with pharmacy.  Gave ok to fill x 1 month but pt must make an appt to have labs checked.  Attempted to reach patient but no answer.

## 2015-05-06 NOTE — Telephone Encounter (Signed)
There is an interaction between Klor-Con and Spironolactone.Do you still want him to take the Klor-Con?

## 2015-05-14 ENCOUNTER — Other Ambulatory Visit: Payer: Self-pay | Admitting: Cardiology

## 2015-06-26 ENCOUNTER — Other Ambulatory Visit: Payer: Self-pay | Admitting: Nurse Practitioner

## 2015-06-26 NOTE — Telephone Encounter (Signed)
Rx(s) sent to pharmacy electronically.  

## 2015-08-04 ENCOUNTER — Other Ambulatory Visit: Payer: Self-pay | Admitting: Cardiology

## 2015-12-02 ENCOUNTER — Ambulatory Visit (INDEPENDENT_AMBULATORY_CARE_PROVIDER_SITE_OTHER): Payer: 59 | Admitting: Cardiology

## 2015-12-02 ENCOUNTER — Encounter: Payer: Self-pay | Admitting: Cardiology

## 2015-12-02 VITALS — BP 150/98 | HR 94 | Ht 69.0 in | Wt 260.0 lb

## 2015-12-02 DIAGNOSIS — I152 Hypertension secondary to endocrine disorders: Secondary | ICD-10-CM

## 2015-12-02 DIAGNOSIS — E876 Hypokalemia: Secondary | ICD-10-CM | POA: Diagnosis not present

## 2015-12-02 DIAGNOSIS — E785 Hyperlipidemia, unspecified: Secondary | ICD-10-CM

## 2015-12-02 DIAGNOSIS — R42 Dizziness and giddiness: Secondary | ICD-10-CM

## 2015-12-02 DIAGNOSIS — I472 Ventricular tachycardia, unspecified: Secondary | ICD-10-CM

## 2015-12-02 DIAGNOSIS — I10 Essential (primary) hypertension: Secondary | ICD-10-CM

## 2015-12-02 MED ORDER — NICOTINE 7 MG/24HR TD PT24: 7.0000 mg | MEDICATED_PATCH | Freq: Every day | TRANSDERMAL | 3 refills | Status: DC

## 2015-12-02 MED ORDER — POTASSIUM CHLORIDE CRYS ER 20 MEQ PO TBCR: 40.0000 meq | EXTENDED_RELEASE_TABLET | Freq: Three times a day (TID) | ORAL | 3 refills | Status: DC

## 2015-12-02 MED ORDER — AMLODIPINE BESYLATE 10 MG PO TABS: 10.0000 mg | ORAL_TABLET | Freq: Every day | ORAL | 3 refills | Status: DC

## 2015-12-02 MED ORDER — NICOTINE 14 MG/24HR TD PT24: 14.0000 mg | MEDICATED_PATCH | Freq: Every day | TRANSDERMAL | 0 refills | Status: DC

## 2015-12-02 MED ORDER — SPIRONOLACTONE 25 MG PO TABS: ORAL_TABLET | ORAL | 3 refills | Status: DC

## 2015-12-02 MED ORDER — HYDRALAZINE HCL 50 MG PO TABS: 50.0000 mg | ORAL_TABLET | Freq: Two times a day (BID) | ORAL | 3 refills | Status: DC

## 2015-12-02 NOTE — Progress Notes (Deleted)
PCP: Bryan Lemmaavid Jaqualyn Juday, MD  Clinic Note: Chief Complaint  Patient presents with  . Follow-up  . Dizziness  . Shortness of Breath    HPI: Larry [name] is a 60 y.o. male with a PMH below who presents today for follow-up of difficult to control hypertension with hypokalemia - process of being evaluated by endocrinology for hyperaldosteronism.  No history of CAD. He may have had a stress test or echocardiogram in the past by Dr. Algie CofferKadakia, but no records are available.  In December 2015, [facility] with L sided CP & PSVT.  He had a negative Myoview. Because of his hypokalemia, he was also been seen by endocrinology during the hospital stay, however this did not happen and he was now finally seen in followup as an outpatient.  Saw Norma FredricksonLori Gerhardt 01/2014 forHospital f/u  CP. Runs of PSVT (with HypoKalemia).  Saw Wilburt FinlayBryan Hager in Jan 2016.  He is working on trying set up primary care followup at the [facility].  Past Medical History:  Diagnosis Date  . AR (allergic rhinitis)   . Hyperaldosteronism (HCC)   . Hyperglycemia 01/2014   A1c 6.0  . Hyperlipidemia   . Hypertension    Difficult to control - ? Secondary to Hyperaldosteronism.  Marland Kitchen. Hypokalemia   . Rectal bleed 2001  . Thyroid nodule 01/2014    Prior Cardiac Evaluation and Past Surgical History: Past Surgical History:  Procedure Laterality Date  . COLONOSCOPY  2001  . ESOPHAGOGASTRODUODENOSCOPY  2001  . NM MYOVIEW LTD  December 2015   EF 50%, No ischemia or infarction  . SMALL BOWEL ENTEROSCOPY  2001   No recent studies or Hospitalizations.   Interval History: He presents today feeling quite well without any major complaints. He basically said that since his blood pressures done under control from hospitalization he has been feeling his duties he is felt in a long time. He is not having any chest discomfort or dyspnea. He is not feeling fatigued or achy. No headaches. He is not had any chest tightness,  pressure or dyspnea at rest or exertion. No PND, orthopnea or edema.  No palpitations, lightheadedness, dizziness, weakness or syncope/near syncope. No TIA/amaurosis fugax symptoms. No melena, hematochezia, hematuria, or epstaxis. No claudication.  ROS: A comprehensive was performed. Review of Systems  Constitutional: Negative for chills, fever and malaise/fatigue.  HENT: Positive for congestion.        Cold Sx since 10/3 after Flu shot; no post-nasal gtt. Taking something OTC - Mucinex etc.  Still bothering him.    Respiratory: Positive for cough. Negative for sputum production.   Gastrointestinal: Positive for abdominal pain (He notes a fullness in the left upper quadrant he also has a warmth localized swelling area. He thinks may be a tic embedded).  Skin: Negative for rash.  Neurological: Positive for headaches. Negative for weakness.  Endo/Heme/Allergies: Does not bruise/bleed easily.  Psychiatric/Behavioral: Negative for depression. The patient is not nervous/anxious.   All other systems reviewed and are negative.   Prior to Admission medications   Medication Sig Start Date End Date Taking? Authorizing Provider  amLODipine (NORVASC) 10 MG tablet Take 1 tablet (10 mg total) by mouth daily. PLEASE CONTACT OFFICE FOR ADDITIONAL REFILLS 05/14/15  Yes Marykay Lexavid W Malekai Markwood, MD  aspirin 81 MG tablet Take 81 mg by mouth daily.   Yes Historical Provider, MD  hydrALAZINE (APRESOLINE) 50 MG tablet Take 1 tablet (50 mg total) by mouth 2 (two) times daily. "NEEDS  APPOINTMENT IN SEPT 2016" 08/04/14  Yes Marykay Lex, MD  potassium chloride SA (K-DUR,KLOR-CON) 20 MEQ tablet Take 2 tablets (40 mEq total) by mouth 3 (three) times daily. PLEASE SCHEDULE APPOINTMENT. 02/17/15  Yes Marykay Lex, MD  spironolactone (ALDACTONE) 25 MG tablet TAKE 1 TABLET (25 MG TOTAL) BY MOUTH DAILY. <PLEASE MAKE APPOINTMENT FOR REFILLS> 08/04/15  Yes Marykay Lex, MD     No Known Allergies  Social History    Substance Use Topics  . Smoking status: Current Every Day Smoker    Packs/day: 0.50    Years: 40.00    Types: Cigarettes  . Smokeless tobacco: Never Used  . Alcohol use 0.0 oz/week     Comment: "I just drink on the weekend"   family history includes Cancer in his mother; Diabetes in his mother; Heart attack in his father; Heart failure in his mother; Hyperlipidemia in his father; Hypertension in his father and sister; Sleep apnea in his sister.  Wt Readings from Last 3 Encounters:  12/02/15 117.9 kg (260 lb)  06/12/14 114.1 kg (251 lb 9.6 oz)  03/13/14 116.8 kg (257 lb 9.6 oz)    PHYSICAL EXAM BP (!) 150/98   Pulse 94   Ht 5\' 9"  (1.753 m)   Wt 117.9 kg (260 lb)   BMI 38.40 kg/m  General appearance: alert, cooperative, appears stated age, no distress and moderately obese; well-nourished and well-groomed; normal mood and affect HEENT: [place]/AT, EOMI, MMM, anicteric sclera Neck: no adenopathy, no carotid bruit and no JVD Lungs: clear to auscultation bilaterally, normal percussion bilaterally and non-labored Heart: regular rate and rhythm, S1& S2 normal, no murmur, click, rub or gallop; nondisplaced PMI Abdomen: soft, non-tender; bowel sounds normal; no masses,  no organomegaly; there is a slight fullness in the left upper quadrant/left of the epigastrium. There is also a small roughly 1-2 cm diameter knot that is somewhat tender Extremities: extremities normal, atraumatic, no cyanosis, and edema  Pulses: 2+ and symmetric;  Neurologic: Mental status: Alert, oriented, thought content appropriate; Cranial nerves: normal (II-XII grossly intact)    Adult ECG Report  Rate: 84 ;  Rhythm: normal sinus rhythm and Possible left atrial enlargement  Narrative Interpretation: normal EKG normal axis, intervals and durations. Normal voltage.  Recent Labs:  Lab Results  Component Value Date   K 4.0 02/19/2014   Lab Results  Component Value Date   CREATININE 1.1 02/19/2014   Lab Results   Component Value Date   CHOL 189 02/04/2014   HDL 41.90 02/04/2014   LDLDIRECT 112.0 02/04/2014   TRIG 203.0 (H) 02/04/2014   CHOLHDL 5 02/04/2014    ASSESSMENT / PLAN: Problem List Items Addressed This Visit    VT (ventricular tachycardia) (HCC) (Chronic)   Hypokalemia (Chronic)   Hyperlipidemia (Chronic)   Essential hypertension - Primary (Chronic)    Other Visit Diagnoses   None.      Followup: 4-5 months    [name], M.D., M.S. Interventional Cardiologist   Pager # [phone]

## 2015-12-02 NOTE — Patient Instructions (Addendum)
Your physician has requested that you have a carotid duplex. This test is an ultrasound of the carotid arteries in your neck. It looks at blood flow through these arteries that supply the brain with blood. Allow one hour for this exam. There are no restrictions or special instructions.CHECK VERTEBRAL ,AND SUBCLAVAIN   IN 3 MONTH HAVE LABS DONE - WILL SEND A LABSLIP  MAY USE NICOTINE PATCH 14 MG PATCH FOR 3 WEEKS THEN DECREASE TO 7 MG FOR 5 WEEKS   Your physician recommends that you schedule a follow-up appointment in: 3 MONTHS  WITH EXTENDER  Your physician wants you to follow-up in: 6 MONTHS WITH DR HARDINGYou will receive a reminder letter in the mail two months in advance. If you don't receive a letter, please call our office to schedule the follow-up appointment.   If you need a refill on your cardiac medications before your next appointment, please call your pharmacy.   Try CLARITIN or  ZYTREC - for cold symptoms along with Robitussin/Mucinex DM

## 2015-12-03 ENCOUNTER — Encounter: Payer: Self-pay | Admitting: Cardiology

## 2015-12-03 NOTE — Assessment & Plan Note (Addendum)
No further episodes. He only has very rare infrequent rapid heart rate spells. If his blood pressure continues to be an issue, would consider beta blocker.

## 2015-12-03 NOTE — Assessment & Plan Note (Addendum)
Presumably this may be related to hyperaldosteronism. He should be on spironolactone along with potassium. He is been out of it for a while, this could explain his blood pressure being higher. Refill spironolactone. Refill hydralazine and introitus again twice a day.  We will check chemistry panel prior to 3 month follow-up with and APP.

## 2015-12-03 NOTE — Assessment & Plan Note (Signed)
Forced lesions gained a lot of weight since his last visit. We talked about the importance of doing his exercise and watching his diet.

## 2015-12-03 NOTE — Assessment & Plan Note (Signed)
Potentially related to hyperaldosteronism. Supplement potassium with by mouth potassium, but also continue spironolactone.

## 2015-12-03 NOTE — Progress Notes (Signed)
PCP: [name], MD  Clinic Note: Chief Complaint  Patient presents with  . Follow-up    Difficult to control hypertension  . Dizziness    with hands raised above his head  . Shortness of Breath    If he overexerts    HPI: Larry [name] is a 60 y.o. male with a PMH below who presents today for Delayed annual follow-up for hypertension and other cardiac risk factors. He has a history of difficult control hypertension with possible hyperaldosteronism. He has hypokalemia. He reportedly had a stress test and echocardiogram done by Dr. documented which were relatively normal in the past. Records unavailable. He does not have a history of CAD. He has had a negative Myoview past noted below. Apparently he has had occasional runs of PSVT with hypokalemia.  [name] was last seen in April 2016.  Recent Hospitalizations: None  Studies Reviewed: None  Interval History: Larry [name] presents today overall pretty stable cardiac standpoint. His major complaint that he is dealing with a head cold for the last couple weeks. He congestion and intermittent cough but is not really proactive cough. He notes feeling little aching and tiredness and fatigue as a result. But really had been doing pretty well before his cold. Prior to his cold he was doing fine getting out doing some activity. He was denying any sensation of chest tightness or pressure with rest or exertion. No dyspnea with rest or exertion. He had rare occasions wears heart rate would go up for this intermittent palpitations. Before his cold, his blood pressure is usually well controlled in the 120-1 30 mmHg range for systolic pressures. He has been out of his spironolactone for over a month, and has been "stretching out his hydralazine, taking it only one tablet a day" for the past month.  If his blood pressures do go up, he will note having a headache and a little dizziness associated with it but usually not.  He and his wife do some  exercise together including some stretching exercises. When he does exercises raising his arms above his head he gets very dizzy and lightheaded. He feels as though the world is moving around and it takes [place]. To recover. Is a relatively new thing for him. He continues to smoke, but is really extended and figure out a way to stop smoking. He still smokes maybe three quarters of a pack a day.  No chest pain or shortness of breath with rest or exertion.  No PND, orthopnea or edema.  No weakness or syncope/near syncope, or TIA/amaurosis fugax symptoms. No claudication.   ROS: A comprehensive was performed. ROS Review of Systems  Constitutional: Negative for chills, fever and malaise/fatigue.  HENT: Positive for congestion.        Cold Sx since 10/3 after Flu shot; no post-nasal gtt. Taking something OTC - Mucinex etc.  Still bothering him.    Respiratory: Positive for cough. Negative for sputum production.        Currently dealing with head cold  Gastrointestinal: Negative for abdominal pain, blood in stool, constipation and melena.  Genitourinary: Negative for hematuria.  Musculoskeletal: Positive for joint pain (Knee and hip pain). Negative for myalgias.  Skin: Negative for rash.  Neurological: Positive for headaches. Negative for weakness.  Endo/Heme/Allergies: Does not bruise/bleed easily.  Psychiatric/Behavioral: Negative for depression. The patient is not nervous/anxious.   All other systems reviewed and are negative.  Past Medical History:  Diagnosis Date  . AR (allergic rhinitis)   .  Hyperaldosteronism (HCC)   . Hyperglycemia 01/2014   A1c 6.0  . Hyperlipidemia   . Hypertension    Difficult to control - ? Secondary to Hyperaldosteronism.  Marland Kitchen Hypokalemia   . Rectal bleed 2001  . Thyroid nodule 01/2014    Past Surgical History:  Procedure Laterality Date  . COLONOSCOPY  2001  . ESOPHAGOGASTRODUODENOSCOPY  2001  . NM MYOVIEW LTD  December 2015   EF 50%, No ischemia or  infarction  . SMALL BOWEL ENTEROSCOPY  2001    Prior to Admission medications   Medication Sig Start Date Taking? Authorizing Provider  amLODipine (NORVASC) 10 MG tablet Take 1 tablet (10 mg total) by mouth daily. 12/02/15 Yes Marykay Lex, MD  aspirin 81 MG tablet Take 81 mg by mouth daily.  Yes Historical Provider, MD  hydrALAZINE (APRESOLINE) 50 MG tablet Take 1 tablet (50 mg total) by mouth 2 (two) times daily. 12/02/15 Yes - but 1 x daily Marykay Lex, MD  potassium chloride SA (K-DUR,KLOR-CON) 20 MEQ tablet Take 2 tablets (40 mEq total) by mouth 3 (three) times daily. 12/02/15 Yes Marykay Lex, MD  spironolactone (ALDACTONE) 25 MG tablet TAKE 1 TABLET (25 MG TOTAL) BY MOUTH DAILY. 12/02/15 No - out  Marykay Lex, MD   No Known Allergies   Social History   Social History  . Marital status: Married    Spouse name: N/A  . Number of children: N/A  . Years of education: N/A   Occupational History  . [employer]    Social History Main Topics  . Smoking status: Current Every Day Smoker    Packs/day: 0.50    Years: 40.00    Types: Cigarettes  . Smokeless tobacco: Never Used  . Alcohol use 0.0 oz/week     Comment: "I just drink on the weekend"  . Drug use: No  . Sexual activity: Not Asked   Other Topics Concern  . None   Social History Narrative   Lives with wife   Family History  Problem Relation Age of Onset  . Cancer Mother   . Heart failure Mother   . Diabetes Mother   . Hypertension Father   . Hyperlipidemia Father   . Heart attack Father   . Sleep apnea Sister   . Hypertension Sister   . Thyroid disease Neg Hx     Wt Readings from Last 3 Encounters:  12/02/15 117.9 kg (260 lb)  06/12/14 114.1 kg (251 lb 9.6 oz)  03/13/14 116.8 kg (257 lb 9.6 oz)    PHYSICAL EXAM BP (!) 150/98   Pulse 94   Ht 5\' 9"  (1.753 m)   Wt 117.9 kg (260 lb)   BMI 38.40 kg/m  General appearance: alert, cooperative, appears stated age, no distress and moderately  obese Neck: no adenopathy, no carotid bruit and no JVD Lungs: clear to auscultation bilaterally, normal percussion bilaterally and non-labored Heart: regular rate and rhythm, S1& S2 normal, no murmur, click, rub or gallop; nondisplaced PMI Abdomen: soft, non-tender; bowel sounds normal; no masses,  no organomegaly; there is a slight fullness in the left upper quadrant/left of the epigastrium. There is also a small roughly 1-2 cm diameter knot that is somewhat tender Extremities: extremities normal, atraumatic, no cyanosis, and edema  Pulses: 2+ and symmetric;  Neurologic: Mental status: Alert, oriented, thought content appropriate    Adult ECG Report  Rate: 94 ;  Rhythm: normal sinus rhythm and New RBBB, left axis deviation (26), borderline LAA.;  Otherwise normal axis, intervals and durations.  Narrative Interpretation: Relatively stable, but right bundle branch block is new.  Other studies Reviewed: Additional studies/ records that were reviewed today include:  Recent Labs:  None available  Lab Results  Component Value Date   K 4.0 02/19/2014   Lab Results  Component Value Date   CREATININE 1.1 02/19/2014    ASSESSMENT / PLAN: Problem List Items Addressed This Visit    VT (ventricular tachycardia) (HCC) (Chronic)    No further episodes. He only has very rare infrequent rapid heart rate spells. If his blood pressure continues to be an issue, would consider beta blocker.      Relevant Medications   hydrALAZINE (APRESOLINE) 50 MG tablet   spironolactone (ALDACTONE) 25 MG tablet   amLODipine (NORVASC) 10 MG tablet   Other Relevant Orders   EKG 12-Lead (Completed)   Basic metabolic panel   VAS US CAROTID   Hypokalemia (Chronic)    Potentially related to hyperaldosteronism. Supplement potassium with by mouth potassium, but also continue spironolactone.      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 He should be on spironolactone along with potassium. He is been out of it for a while, this could explain his blood pressure being higher. Refill spironolactone. Refill hydralazine and introitus again twice a day.  We will check chemistry panel prior to 3 month follow-up with and APP.      Relevant Medications   hydrALAZINE (APRESOLINE) 50 MG tablet   spironolactone (ALDACTONE) 25 MG tablet   amLODipine (NORVASC) 10 MG tablet   Hyperlipidemia (Chronic)   Relevant Medications   hydrALAZINE (APRESOLINE) 50 MG tablet   spironolactone (ALDACTONE) 25 MG tablet   amLODipine (NORVASC) 10 MG tablet   Other Relevant Orders   EKG 12-Lead (Completed)   Basic metabolic panel   VAS US CAROTID   Essential hypertension - Primary (Chronic)   Relevant Medications   hydrALAZINE (APRESOLINE) 50 MG tablet   spironolactone (ALDACTONE) 25 MG tablet   amLODipine (NORVASC) 10 MG tablet   Other Relevant Orders   EKG 12-Lead (Completed)   Basic metabolic panel   VAS US CAROTID   Dizziness    The dizziness he has is with raising his hands above his head. This does sound like vertigo, but I'm worried with his thick neck that he may potentially have some vertebral disease or some subclavian steal type symptoms going on. He is a smoker with hyperlipidemia and high blood pressure.  Plan: Check Carotid Dopplers with intention to view vertebral and subclavian arteries.      Relevant Orders   EKG 12-Lead (Completed)   Basic metabolic panel   VAS US CAROTID    Other Visit Diagnoses   None.     Current medicines are reviewed at length with the patient today. (+/- concerns) Needs refills on his blood pressure medications.  The following changes have been made: Restart spironolactone. Ensure hydralazine is taken twice a day. Next step would be  to add carvedilol.    Patient Instructions  Your physician has requested that you have a carotid duplex. This test is an ultrasound of the carotid arteries in your neck. It looks at blood flow through these arteries that supply the brain with  blood. Allow one hour for this exam. There are no restrictions or special instructions.CHECK VERTEBRAL ,AND SUBCLAVAIN   IN 3 MONTH HAVE LABS DONE - WILL SEND A LABSLIP  MAY USE NICOTINE PATCH 14 MG PATCH FOR 3 WEEKS THEN DECREASE TO 7 MG FOR 5 WEEKS   Your physician recommends that you schedule a follow-up appointment in: 3 MONTHS  WITH EXTENDER  Your physician wants you to follow-up in: 6 MONTHS WITH DR HARDINGYou will receive a reminder letter in the mail two months in advance. If you don't receive a letter, please call our office to schedule the follow-up appointment.   If you need a refill on your cardiac medications before your next appointment, please call your pharmacy.   Try CLARITIN or  ZYTREC - for cold symptoms along with Robitussin/Mucinex DM   Studies Ordered:   Orders Placed This Encounter  Procedures  . Basic metabolic panel  . EKG 12-Lead      [name], M.D., M.S. Interventional Cardiologist   Pager # [phone] Phone # [phone] [address] [place], [place] [address]

## 2015-12-03 NOTE — Assessment & Plan Note (Signed)
The dizziness he has is with raising his hands above his head. This does sound like vertigo, but I'm worried with his thick neck that he may potentially have some vertebral disease or some subclavian steal type symptoms going on. He is a smoker with hyperlipidemia and high blood pressure.  Plan: Check Carotid Dopplers with intention to view vertebral and subclavian arteries.

## 2015-12-08 ENCOUNTER — Inpatient Hospital Stay (HOSPITAL_COMMUNITY): Admission: RE | Admit: 2015-12-08 | Payer: 59 | Source: Ambulatory Visit

## 2015-12-14 ENCOUNTER — Encounter (HOSPITAL_COMMUNITY): Payer: Self-pay | Admitting: Cardiology

## 2016-03-07 ENCOUNTER — Ambulatory Visit: Payer: 59 | Admitting: Physician Assistant

## 2016-03-07 ENCOUNTER — Encounter: Payer: Self-pay | Admitting: *Deleted

## 2016-03-07 NOTE — Progress Notes (Deleted)
Cardiology Office Note   Date:  03/07/2016   ID:  Larry Mendsrvin L Krieger, DOB 1955/12/24, MRN [id number]  PCP:  Bryan Lemmaavid Harding, MD  Cardiologist:  Dr Herbie BaltimoreHarding 12/02/2015 Theodore DemarkBarrett, Rhonda, PA-C   No chief complaint on file.   History of Present Illness: Larry [name] is a 61 y.o. male with a history of ***  Larry Mendsrvin L Politte presents for ***   Past Medical History:  Diagnosis Date  . AR (allergic rhinitis)   . Hyperaldosteronism (HCC)   . Hyperglycemia 01/2014   A1c 6.0  . Hyperlipidemia   . Hypertension    Difficult to control - ? Secondary to Hyperaldosteronism.  Marland Kitchen. Hypokalemia   . Rectal bleed 2001  . Thyroid nodule 01/2014    Past Surgical History:  Procedure Laterality Date  . COLONOSCOPY  2001  . ESOPHAGOGASTRODUODENOSCOPY  2001  . NM MYOVIEW LTD  December 2015   EF 50%, No ischemia or infarction  . SMALL BOWEL ENTEROSCOPY  2001    Current Outpatient Prescriptions  Medication Sig Dispense Refill  . amLODipine (NORVASC) 10 MG tablet Take 1 tablet (10 mg total) by mouth daily. 90 tablet 3  . aspirin 81 MG tablet Take 81 mg by mouth daily.    . hydrALAZINE (APRESOLINE) 50 MG tablet Take 1 tablet (50 mg total) by mouth 2 (two) times daily. 180 tablet 3  . nicotine (NICODERM CQ - DOSED IN MG/24 HR) 7 mg/24hr patch Place 1 patch (7 mg total) onto the skin daily. 14 patch 3  . nicotine (NICODERM CQ) 14 mg/24hr patch Place 1 patch (14 mg total) onto the skin daily. 21 patch 0  . potassium chloride SA (K-DUR,KLOR-CON) 20 MEQ tablet Take 2 tablets (40 mEq total) by mouth 3 (three) times daily. 540 tablet 3  . spironolactone (ALDACTONE) 25 MG tablet TAKE 1 TABLET (25 MG TOTAL) BY MOUTH DAILY. 90 tablet 3   No current facility-administered medications for this visit.     Allergies:   Patient has no known allergies.    Social History:  The patient  reports that he has been smoking Cigarettes.  He has a 20.00 pack-year smoking history. He has never used smokeless tobacco. He  reports that he drinks alcohol. He reports that he does not use drugs.   Family History:  The patient's family history includes Cancer in his mother; Diabetes in his mother; Heart attack in his father; Heart failure in his mother; Hyperlipidemia in his father; Hypertension in his father and sister; Sleep apnea in his sister.    ROS:  Please see the history of present illness. All other systems are reviewed and negative.    PHYSICAL EXAM: VS:  There were no vitals taken for this visit. , BMI There is no height or weight on file to calculate BMI. GEN: Well nourished, well developed, male in no acute distress  HEENT: normal for age  Neck: no JVD, no carotid bruit, no masses Cardiac: RRR; no murmur, no rubs, or gallops Respiratory:  clear to auscultation bilaterally, normal work of breathing GI: soft, nontender, nondistended, + BS MS: no deformity or atrophy; no edema; distal pulses are 2+ in all 4 extremities   Skin: warm and dry, no rash Neuro:  Strength and sensation are intact Psych: euthymic mood, full affect   EKG:  EKG {ACTION; IS/IS [id number]} ordered today. The ekg ordered today demonstrates ***   Recent Labs: No results found for requested labs within last 8760 hours.    Lipid  Panel    Component Value Date/Time   CHOL 189 02/04/2014 0955   TRIG 203.0 (H) 02/04/2014 0955   HDL 41.90 02/04/2014 0955   CHOLHDL 5 02/04/2014 0955   VLDL 40.6 (H) 02/04/2014 0955   LDLDIRECT 112.0 02/04/2014 0955     Wt Readings from Last 3 Encounters:  12/02/15 260 lb (117.9 kg)  06/12/14 251 lb 9.6 oz (114.1 kg)  03/13/14 257 lb 9.6 oz (116.8 kg)     Other studies Reviewed: Additional studies/ records that were reviewed today include: ***.  ASSESSMENT AND PLAN:  1.  ***   Current medicines are reviewed at length with the patient today.  The patient {ACTIONS; HAS/DOES NOT HAVE:19233} concerns regarding medicines.  The following changes have been made:  {PLAN; NO  CHANGE:13088:s}  Labs/ tests ordered today include: *** No orders of the defined types were placed in this encounter.    Disposition:   FU with ***  Signed, Theodore Demark, PA-C  03/07/2016 12:23 PM    [facility] Phone: [phone]; Fax: [phone]  This note was written with the assistance of speech recognition software. Please excuse any transcriptional errors.

## 2016-03-07 NOTE — Progress Notes (Deleted)
Cardiology Office Note   Date:  03/07/2016   ID:  Larry [name], DOB 23-Feb-1955, MRN [id number]  PCP:  Bryan Lemmaavid Harding, MD  Cardiologist:  Dr Herbie BaltimoreHarding 12/03/2015 Theodore DemarkBarrett, Rhonda, PA-C   No chief complaint on file.   History of Present Illness: Larry [name] is a 61 y.o. male with a history of HTN, HLD, hyperaldosteronism, hyperglycemia, hypokalemia, thyroid nodule  12/03/2015 office visit w/ pt getting dizzy when arms are raised>>carotid dopplers ordered, diet & exercise for wt loss recommended, no VT, no rx change, supp K+. Carotid dopplers not performed  Larry [name] presents for ***   Past Medical History:  Diagnosis Date  . AR (allergic rhinitis)   . Hyperaldosteronism (HCC)   . Hyperglycemia 01/2014   A1c 6.0  . Hyperlipidemia   . Hypertension    Difficult to control - ? Secondary to Hyperaldosteronism.  Marland Kitchen. Hypokalemia   . Rectal bleed 2001  . Thyroid nodule 01/2014    Past Surgical History:  Procedure Laterality Date  . COLONOSCOPY  2001  . ESOPHAGOGASTRODUODENOSCOPY  2001  . NM MYOVIEW LTD  December 2015   EF 50%, No ischemia or infarction  . SMALL BOWEL ENTEROSCOPY  2001    Current Outpatient Prescriptions  Medication Sig Dispense Refill  . amLODipine (NORVASC) 10 MG tablet Take 1 tablet (10 mg total) by mouth daily. 90 tablet 3  . aspirin 81 MG tablet Take 81 mg by mouth daily.    . hydrALAZINE (APRESOLINE) 50 MG tablet Take 1 tablet (50 mg total) by mouth 2 (two) times daily. 180 tablet 3  . nicotine (NICODERM CQ - DOSED IN MG/24 HR) 7 mg/24hr patch Place 1 patch (7 mg total) onto the skin daily. 14 patch 3  . nicotine (NICODERM CQ) 14 mg/24hr patch Place 1 patch (14 mg total) onto the skin daily. 21 patch 0  . potassium chloride SA (K-DUR,KLOR-CON) 20 MEQ tablet Take 2 tablets (40 mEq total) by mouth 3 (three) times daily. 540 tablet 3  . spironolactone (ALDACTONE) 25 MG tablet TAKE 1 TABLET (25 MG TOTAL) BY MOUTH DAILY. 90 tablet 3   No current  facility-administered medications for this visit.     Allergies:   Patient has no known allergies.    Social History:  The patient  reports that he has been smoking Cigarettes.  He has a 20.00 pack-year smoking history. He has never used smokeless tobacco. He reports that he drinks alcohol. He reports that he does not use drugs.   Family History:  The patient's family history includes Cancer in his mother; Diabetes in his mother; Heart attack in his father; Heart failure in his mother; Hyperlipidemia in his father; Hypertension in his father and sister; Sleep apnea in his sister.    ROS:  Please see the history of present illness. All other systems are reviewed and negative.    PHYSICAL EXAM: VS:  There were no vitals taken for this visit. , BMI There is no height or weight on file to calculate BMI. GEN: Well nourished, well developed, male in no acute distress  HEENT: normal for age  Neck: no JVD, no carotid bruit, no masses Cardiac: RRR; no murmur, no rubs, or gallops Respiratory:  clear to auscultation bilaterally, normal work of breathing GI: soft, nontender, nondistended, + BS MS: no deformity or atrophy; no edema; distal pulses are 2+ in all 4 extremities   Skin: warm and dry, no rash Neuro:  Strength and sensation are intact Psych:  euthymic mood, full affect   EKG:  EKG {ACTION; IS/IS ZOX:[id number]} ordered today. The ekg ordered today demonstrates ***  ECHO: 01/2014 - Left ventricle: The cavity size was normal. There was moderate asymmetric hypertrophy. Systolic function was normal. The estimated ejection fraction was in the range of 55% to 60%. Wall motion was normal; there were no regional wall motion abnormalities. Doppler parameters are consistent with abnormal left ventricular relaxation (grade 1 diastolic dysfunction). - Left atrium: The atrium was moderately to severely dilated. - Atrial septum: No defect or patent foramen ovale was  identified.   Recent Labs: No results found for requested labs within last 8760 hours.    Lipid Panel    Component Value Date/Time   CHOL 189 02/04/2014 0955   TRIG 203.0 (H) 02/04/2014 0955   HDL 41.90 02/04/2014 0955   CHOLHDL 5 02/04/2014 0955   VLDL 40.6 (H) 02/04/2014 0955   LDLDIRECT 112.0 02/04/2014 0955     Wt Readings from Last 3 Encounters:  12/02/15 260 lb (117.9 kg)  06/12/14 251 lb 9.6 oz (114.1 kg)  03/13/14 257 lb 9.6 oz (116.8 kg)     Other studies Reviewed: Additional studies/ records that were reviewed today include: ***.  ASSESSMENT AND PLAN:  1.  ***   Current medicines are reviewed at length with the patient today.  The patient {ACTIONS; HAS/DOES NOT HAVE:19233} concerns regarding medicines.  The following changes have been made:  {PLAN; NO CHANGE:13088:s}  Labs/ tests ordered today include: *** No orders of the defined types were placed in this encounter.    Disposition:   FU with ***  Signed, Theodore Demark, PA-C  03/07/2016 12:09 PM    [facility] Phone: [phone]; Fax: [phone]  This note was written with the assistance of speech recognition software. Please excuse any transcriptional errors.

## 2016-06-15 ENCOUNTER — Encounter: Payer: Self-pay | Admitting: Cardiology

## 2016-06-15 ENCOUNTER — Ambulatory Visit (INDEPENDENT_AMBULATORY_CARE_PROVIDER_SITE_OTHER): Payer: 59 | Admitting: Cardiology

## 2016-06-15 VITALS — BP 130/76 | HR 80 | Ht 69.5 in | Wt 260.0 lb

## 2016-06-15 DIAGNOSIS — E784 Other hyperlipidemia: Secondary | ICD-10-CM

## 2016-06-15 DIAGNOSIS — I152 Hypertension secondary to endocrine disorders: Secondary | ICD-10-CM | POA: Diagnosis not present

## 2016-06-15 DIAGNOSIS — I472 Ventricular tachycardia, unspecified: Secondary | ICD-10-CM

## 2016-06-15 DIAGNOSIS — I10 Essential (primary) hypertension: Secondary | ICD-10-CM

## 2016-06-15 DIAGNOSIS — Z716 Tobacco abuse counseling: Secondary | ICD-10-CM

## 2016-06-15 DIAGNOSIS — E668 Other obesity: Secondary | ICD-10-CM | POA: Diagnosis not present

## 2016-06-15 DIAGNOSIS — E269 Hyperaldosteronism, unspecified: Secondary | ICD-10-CM

## 2016-06-15 DIAGNOSIS — E876 Hypokalemia: Secondary | ICD-10-CM

## 2016-06-15 DIAGNOSIS — E7849 Other hyperlipidemia: Secondary | ICD-10-CM

## 2016-06-15 MED ORDER — HYDRALAZINE HCL 50 MG PO TABS: 50.0000 mg | ORAL_TABLET | Freq: Two times a day (BID) | ORAL | 3 refills | Status: DC

## 2016-06-15 MED ORDER — SPIRONOLACTONE 25 MG PO TABS: ORAL_TABLET | ORAL | 3 refills | Status: DC

## 2016-06-15 MED ORDER — POTASSIUM CHLORIDE CRYS ER 20 MEQ PO TBCR: 40.0000 meq | EXTENDED_RELEASE_TABLET | Freq: Three times a day (TID) | ORAL | 3 refills | Status: DC

## 2016-06-15 MED ORDER — AMLODIPINE BESYLATE 10 MG PO TABS: 10.0000 mg | ORAL_TABLET | Freq: Every day | ORAL | 3 refills | Status: DC

## 2016-06-15 NOTE — Assessment & Plan Note (Signed)
Presumed hypertension related to primary hyperaldosteronism. Remains on spironolactone and potassium along with hydralazine and amlodipine. Blood pressure looks much better today.  We did use hydralazine as a posterior and ARB. His blood pressure becomes higher, then we can probably add an ARB. He is also on stable dose of amlodipine with no significant edema.

## 2016-06-15 NOTE — Assessment & Plan Note (Signed)
I have not seen recent labs. Since we are checking his chemistries will then check his lipid panel he has not eaten yet today. He is not on a statin. Previous labs were relatively stable.

## 2016-06-15 NOTE — Assessment & Plan Note (Signed)
Relatively infrequent episodes. Rare. No symptoms recently. Probably his next best option for high blood pressure control would be a beta blocker.

## 2016-06-15 NOTE — Assessment & Plan Note (Signed)
Presumptive diagnosis. I'm not convinced that the entirety of the data suggested this, however his symptoms are notably improved and blood pressure is much better controlled with outwardly that much in the way of significant medications. He has not had much less checked in a long time which we should probably check.

## 2016-06-15 NOTE — Assessment & Plan Note (Signed)
Thought to be related to hyperaldosteronism. With him being on spironolactone and supplement potassium, we need to reassess his chemistry panel.  He was supposed to have had a lipid and chemistry pounds checked at last visit that did not happen. We will go ahead and have it checked today. For now continue potassium replacement as he has had minimal cramping when he was out of it for a while.

## 2016-06-15 NOTE — Assessment & Plan Note (Signed)
The patient understands the need to lose weight with diet and exercise. We have discussed specific strategies for this.  

## 2016-06-15 NOTE — Progress Notes (Signed)
PCP: No PCP Per Patient  Clinic Note: Chief Complaint  Patient presents with  . Follow-up    no chest pain, frequently shortness of breath, occasional edema, no pain or cramping in legs, occassional lightheaded & dizziness  . Hypertension    HPI: Larry [name] is a 61 y.o. male with a PMH below who presents today for Six-month follow-up of hypertension and cardiac risk factors. He is a history of difficult to control hypertension with possible hyperaldosteronism.Marland Kitchen  He reportedly had a stress test and echocardiogram done by Dr. documented which were relatively normal in the past. Records unavailable. He does not have a history of CAD. He has had a negative Myoview past noted below. Apparently he has had occasional runs of PVT with hypokalemia.  Larry [name] was last seen on 12/02/2015. He was doing fairly well with exception of having a cold. Rare palpitations. Well-controlled blood pressure  Recent Hospitalizations: None  Studies Personally Reviewed - if available, images/films reviewed: From Epic Chart or Care Everywhere: None  Interval History: Larry [name] presents today for follow-up really happy with his blood pressure control. He is no longer having the headache and blurred vision episodes he was having with high blood pressure. He does acknowledge that over the wintertime he must obtain a little bit overweight, but otherwise has been doing quite well he is still active but doesn't get routine exercise. He denies any sensation of chest tightness or pressure with rest or exertion. He only gets some exertional dyspnea because he is deconditioned and not more so than one would expect - he has nurses more sensitive if the weight gain than before.Marland Kitchen No PND, orthopnea or edema.  Rare palpitations, but only a little bit of lightheadedness and dizziness with changes in position. Otherwise no weakness or syncope/near syncope. No TIA/amaurosis fugax symptoms. No melena, hematochezia,  hematuria, or epstaxis. No claudication.  ROS: A comprehensive was performed. Review of Systems  Constitutional: Negative for malaise/fatigue.  HENT: Negative.   Respiratory: Negative for cough and wheezing.        He is snoring more at night, denies any symptoms of daytime fatigue or sleepiness.  Gastrointestinal: Positive for heartburn. Negative for blood in stool and melena.  Genitourinary: Negative for hematuria.  Musculoskeletal: Positive for joint pain and neck pain.  Skin: Negative.   Neurological: Negative for dizziness and focal weakness.  Endo/Heme/Allergies: Negative.   Psychiatric/Behavioral: Negative.    I have reviewed and (if needed) personally updated the patient's problem list, medications, allergies, past medical and surgical history, social and family history.   Past Medical History:  Diagnosis Date  . AR (allergic rhinitis)   . Hyperaldosteronism (HCC)   . Hyperglycemia 01/2014   A1c 6.0  . Hyperlipidemia   . Hypertension    Difficult to control - ? Secondary to Hyperaldosteronism.  Marland Kitchen Hypokalemia   . Rectal bleed 2001  . Thyroid nodule 01/2014    Past Surgical History:  Procedure Laterality Date  . COLONOSCOPY  2001  . ESOPHAGOGASTRODUODENOSCOPY  2001  . NM MYOVIEW LTD  December 2015   EF 50%, No ischemia or infarction  . SMALL BOWEL ENTEROSCOPY  2001    Current Meds  Medication Sig  . amLODipine (NORVASC) 10 MG tablet Take 1 tablet (10 mg total) by mouth daily.  Marland Kitchen aspirin 81 MG tablet Take 81 mg by mouth daily.  . hydrALAZINE (APRESOLINE) 50 MG tablet Take 1 tablet (50 mg total) by mouth 2 (two) times daily.  Marland Kitchen  potassium chloride SA (K-DUR,KLOR-CON) 20 MEQ tablet Take 2 tablets (40 mEq total) by mouth 3 (three) times daily.  Marland Kitchen spironolactone (ALDACTONE) 25 MG tablet TAKE 1 TABLET (25 MG TOTAL) BY MOUTH DAILY.  . [DISCONTINUED] amLODipine (NORVASC) 10 MG tablet Take 1 tablet (10 mg total) by mouth daily.  . [DISCONTINUED] hydrALAZINE (APRESOLINE)  50 MG tablet Take 1 tablet (50 mg total) by mouth 2 (two) times daily.  . [DISCONTINUED] potassium chloride SA (K-DUR,KLOR-CON) 20 MEQ tablet Take 2 tablets (40 mEq total) by mouth 3 (three) times daily.  . [DISCONTINUED] spironolactone (ALDACTONE) 25 MG tablet TAKE 1 TABLET (25 MG TOTAL) BY MOUTH DAILY.    No Known Allergies  Social History   Social History  . Marital status: Married    Spouse name: N/A  . Number of children: N/A  . Years of education: N/A   Occupational History  . [employer]    Social History Main Topics  . Smoking status: Current Every Day Smoker    Packs/day: 0.50    Years: 40.00    Types: Cigarettes  . Smokeless tobacco: Never Used  . Alcohol use 0.0 oz/week     Comment: "I just drink on the weekend"  . Drug use: No  . Sexual activity: Not Asked   Other Topics Concern  . None   Social History Narrative   Lives with wife    family history includes Cancer in his mother; Diabetes in his mother; Heart attack in his father; Heart failure in his mother; Hyperlipidemia in his father; Hypertension in his father and sister; Sleep apnea in his sister.  Wt Readings from Last 3 Encounters:  06/15/16 260 lb (117.9 kg)  12/02/15 260 lb (117.9 kg)  06/12/14 251 lb 9.6 oz (114.1 kg)    PHYSICAL EXAM BP 130/76   Pulse 80   Ht 5' 9.5" (1.765 m)   Wt 260 lb (117.9 kg)   BMI 37.84 kg/m  General appearance: alert, cooperative, appears stated age, no distress and Moderately obese; healthy-appearing  HEENT: [place]/AT, EOMI, MMM, anicteric sclera Neck: no adenopathy, no carotid bruit and no JVD Lungs: clear to auscultation bilaterally, normal percussion bilaterally and non-labored Heart: regular rate and rhythm, S1 and S2 normal, no murmur, click, rub or gallop; nondisplaced PMI Abdomen: soft, non-tender; bowel sounds normal; no masses,  no organomegaly; no HJR Extremities: extremities normal, atraumatic, no cyanosis, and edema trivial Pulses: 2+ and symmetric;    Skin: mobility and turgor normal, no evidence of bleeding or bruising, no lesions noted, temperature normal and texture normal or  Neurologic: Mental status: Alert, oriented, thought content appropriate;  otherwise normal mood and affect.   Adult ECG Report n/a  Other studies Reviewed: Additional studies/ records that were reviewed today include:  Recent Labs:   Lab Results  Component Value Date   CREATININE 1.1 02/19/2014   BUN 18 02/19/2014   NA 138 02/19/2014   K 4.0 02/19/2014   CL 109 02/19/2014   CO2 25 02/19/2014    ASSESSMENT / PLAN: Problem List Items Addressed This Visit    Essential hypertension (Chronic)   Relevant Medications   amLODipine (NORVASC) 10 MG tablet   hydrALAZINE (APRESOLINE) 50 MG tablet   spironolactone (ALDACTONE) 25 MG tablet   Hyperaldosteronism (HCC)    Presumptive diagnosis. I'm not convinced that the entirety of the data suggested this, however his symptoms are notably improved and blood pressure is much better controlled with outwardly that much in the way of significant medications.  He has not had much less checked in a long time which we should probably check.      Relevant Orders   Comprehensive metabolic panel   Hyperlipidemia (Chronic)    I have not seen recent labs. Since we are checking his chemistries will then check his lipid panel he has not eaten yet today. He is not on a statin. Previous labs were relatively stable.      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 Remains on spironolactone and potassium along with hydralazine and amlodipine. Blood pressure looks much better today.  We did use hydralazine as a posterior and ARB. His blood  pressure becomes higher, then we can probably add an ARB. He is also on stable dose of amlodipine with no significant edema.      Relevant Medications   amLODipine (NORVASC) 10 MG tablet   hydrALAZINE (APRESOLINE) 50 MG tablet   spironolactone (ALDACTONE) 25 MG tablet   Other Relevant Orders   Comprehensive metabolic panel   Hypokalemia (Chronic)    Thought to be related to hyperaldosteronism. With him being on spironolactone and supplement potassium, we need to reassess his chemistry panel.  He was supposed to have had a lipid and chemistry pounds checked at last visit that did not happen. We will go ahead and have it checked today. For now continue potassium replacement as he has had minimal cramping when he was out of it for a while.      Moderate obesity (Chronic)    The patient understands the need to lose weight with diet and exercise. We have discussed specific strategies for this.      Relevant Orders   Lipid panel   Comprehensive metabolic panel   Tobacco abuse counseling    Unfortunately he is still smoking close to back to day. We talked about different options and I think the plan for now is going to be to attempt to use patches. I told her they're over-the-counter. The plan will be to set a quit date and then start with the 20 mg patches for up to 3 weeks depending on his cravings. And then wean down to 14 mg down to 7 mg.  4 minutes      VT (ventricular tachycardia) (HCC) (Chronic)    Relatively infrequent episodes. Rare. No symptoms recently. Probably his next best option for high blood pressure control would be a beta blocker.      Relevant Medications   amLODipine (NORVASC) 10 MG tablet   hydrALAZINE (APRESOLINE) 50 MG tablet   spironolactone (ALDACTONE) 25 MG tablet      Current medicines are reviewed at length with the patient today. (+/- concerns) n/a The following changes have been made: see below  Patient Instructions  Labs - please go to [address] Lipids  cmp Do not eat or drink the morning    Your physician recommends that you schedule a follow-up appointment in 6 months with  Ward Givens NP    Your physician wants you to follow-up in 12 MONTHS WITH DR Jayvian Escoe. You will receive a reminder letter in the mail two months in advance. If you don't receive a letter, please call our office  to schedule the follow-up appointment.   If you need a refill on your cardiac medications before your next appointment, please call your pharmacy.      Studies Ordered:   Orders Placed This Encounter  Procedures  . Lipid panel  . Comprehensive metabolic panel     [name], M.D., M.S. Interventional Cardiologist   Pager # [phone] Phone # [phone] [address] [place], [place] [address]

## 2016-06-15 NOTE — Patient Instructions (Signed)
Labs - please go to [address] Lipids  cmp Do not eat or drink the morning    Your physician recommends that you schedule a follow-up appointment in 6 months with  Ward Givens NP    Your physician wants you to follow-up in 12 MONTHS WITH DR HARDING. You will receive a reminder letter in the mail two months in advance. If you don't receive a letter, please call our office to schedule the follow-up appointment.   If you need a refill on your cardiac medications before your next appointment, please call your pharmacy.

## 2016-06-15 NOTE — Assessment & Plan Note (Signed)
Unfortunately he is still smoking close to back to day. We talked about different options and I think the plan for now is going to be to attempt to use patches. I told her they're over-the-counter. The plan will be to set a quit date and then start with the 20 mg patches for up to 3 weeks depending on his cravings. And then wean down to 14 mg down to 7 mg.  4 minutes

## 2016-12-16 ENCOUNTER — Other Ambulatory Visit: Payer: Self-pay | Admitting: Cardiology

## 2017-03-20 ENCOUNTER — Ambulatory Visit: Payer: 59 | Admitting: Cardiology

## 2017-03-20 ENCOUNTER — Encounter: Payer: Self-pay | Admitting: Cardiology

## 2017-03-20 VITALS — BP 132/80 | HR 92 | Ht 69.0 in | Wt 258.6 lb

## 2017-03-20 DIAGNOSIS — E041 Nontoxic single thyroid nodule: Secondary | ICD-10-CM | POA: Diagnosis not present

## 2017-03-20 DIAGNOSIS — R739 Hyperglycemia, unspecified: Secondary | ICD-10-CM

## 2017-03-20 DIAGNOSIS — E668 Other obesity: Secondary | ICD-10-CM | POA: Diagnosis not present

## 2017-03-20 DIAGNOSIS — E669 Obesity, unspecified: Secondary | ICD-10-CM

## 2017-03-20 DIAGNOSIS — I472 Ventricular tachycardia, unspecified: Secondary | ICD-10-CM

## 2017-03-20 DIAGNOSIS — I152 Hypertension secondary to endocrine disorders: Secondary | ICD-10-CM

## 2017-03-20 DIAGNOSIS — E876 Hypokalemia: Secondary | ICD-10-CM | POA: Diagnosis not present

## 2017-03-20 DIAGNOSIS — E782 Mixed hyperlipidemia: Secondary | ICD-10-CM

## 2017-03-20 DIAGNOSIS — I10 Essential (primary) hypertension: Secondary | ICD-10-CM | POA: Diagnosis not present

## 2017-03-20 DIAGNOSIS — Z716 Tobacco abuse counseling: Secondary | ICD-10-CM | POA: Diagnosis not present

## 2017-03-20 MED ORDER — HYDRALAZINE HCL 25 MG PO TABS: 25.0000 mg | ORAL_TABLET | Freq: Two times a day (BID) | ORAL | 3 refills | Status: DC

## 2017-03-20 MED ORDER — CARVEDILOL 6.25 MG PO TABS: 6.2500 mg | ORAL_TABLET | Freq: Two times a day (BID) | ORAL | 3 refills | Status: DC

## 2017-03-20 NOTE — Patient Instructions (Addendum)
MEDICATION CHANGES  DECREASE HYDRALAZINE TO 25 MG  TABLET TWICE A DAY      MAY  USE HYDRALAZINE  50 MG  ( TAKE 2 TABLETS) AS NEEDED FOR BLOOD      PRESSURE GREATER THAN 160/? --TOP NUMBER OF BLOOD PRESSURE.   START CARVEDILOL ( COREG) 6.25 MG TWICE A DAY     LABS DO NOT EAT ORD DRINK THE MORNING OF THE TEST CMP LIPI CBC TSH HGBA1C MAY HAVE LABS DONE AT THIS OFFICE, NO APPOINTMENT NEEDED.    Your physician wants you to follow-up in 6 MONTHS WITH PA/NP AND 12 MONTHS WITH DR HARDING.  You will receive a reminder letter in the mail two months in advance. If you don't receive a letter, please call our office to schedule the follow-up appointment.

## 2017-03-20 NOTE — Progress Notes (Signed)
PCP: Patient, No Pcp Per  Clinic Note: Chief Complaint  Patient presents with  . Follow-up    Hypertension, cardiac risk factors (obesity, hyperlipidemia)    HPI: Larry [name] is a 62 y.o. male with a PMH below who presents today for Six-month follow-up of hypertension and cardiac risk factors. He is a history of difficult to control hypertension with possible hyperaldosteronism.Marland Kitchen  He reportedly had a stress test and echocardiogram done which were relatively normal in the past. Records unavailable. He does not have a history of CAD. He has had a negative Myoview past noted below. Apparently he has had occasional runs of PVT with hypokalemia.  Larry [name] was last seen on May 2018. He was doing fairly well with well-controlled blood pressure.  No longer having headache and blurred vision.  Recent Hospitalizations: None  Studies Personally Reviewed - if available, images/films reviewed: From Epic Chart or Care Everywhere: None  Interval History: Larry [name] presents today overall feeling pretty well.  He has had a few more palpitations than usual, but nothing prolonged.  He thinks he may have pulled a muscle in his back and upper chest, because he was having some aching discomfort in his chest for several weeks now.  Not made worse with exertion, does not seem to be at all concern from a angina standpoint.  He freely admits to being deconditioned, overweight and does note that he gets a little bit of exertional dyspnea, but has not really tried doing much either. As far as his blood pressure goes however, he is definitely happy with the level of control that he has now.  He denies any further headaches or blurred vision episodes that he used to have.  He is definitely less short of breath with a blood pressure being better.  No PND, orthopnea or edema. Although he does have some palpitations, they are not that frequent, but they bother him when they are present. He denies any  lightheadedness, dizziness, wooziness, syncope/near syncope or TIA/amaurosis fugax. No claudication  ROS: A comprehensive was performed. Review of Systems  Constitutional: Negative for malaise/fatigue.  HENT: Negative.   Respiratory: Negative for cough and wheezing.        He is snoring more at night, denies any symptoms of daytime fatigue or sleepiness.  Gastrointestinal: Positive for heartburn. Negative for blood in stool and melena.  Genitourinary: Negative for hematuria.  Musculoskeletal: Positive for joint pain and neck pain.  Skin: Negative.   Neurological: Negative for dizziness and focal weakness.  Endo/Heme/Allergies: Negative.   Psychiatric/Behavioral: Negative.  Negative for memory loss.  All other systems reviewed and are negative.  I have reviewed and (if needed) personally updated the patient's problem list, medications, allergies, past medical and surgical history, social and family history.   Past Medical History:  Diagnosis Date  . AR (allergic rhinitis)   . Hyperaldosteronism (HCC)   . Hyperglycemia 01/2014   A1c 6.0  . Hyperlipidemia   . Hypertension    Difficult to control - ? Secondary to Hyperaldosteronism.  Marland Kitchen Hypokalemia   . Rectal bleed 2001  . Thyroid nodule 01/2014    Past Surgical History:  Procedure Laterality Date  . COLONOSCOPY  2001  . ESOPHAGOGASTRODUODENOSCOPY  2001  . NM MYOVIEW LTD  December 2015   EF 50%, No ischemia or infarction  . SMALL BOWEL ENTEROSCOPY  2001    Current Meds  Medication Sig  . amLODipine (NORVASC) 10 MG tablet Take 1 tablet (10 mg total) by  mouth daily.  Marland Kitchen aspirin 81 MG tablet Take 81 mg by mouth daily.  . potassium chloride SA (K-DUR,KLOR-CON) 20 MEQ tablet Take 2 tablets (40 mEq total) by mouth 3 (three) times daily.  Marland Kitchen spironolactone (ALDACTONE) 25 MG tablet TAKE 1 TABLET (25 MG TOTAL) BY MOUTH DAILY.  . [DISCONTINUED] hydrALAZINE (APRESOLINE) 50 MG tablet TAKE 1 TABLET BY MOUTH TWICE A DAY    No Known  Allergies  Social History   Socioeconomic History  . Marital status: Married    Spouse name: None  . Number of children: None  . Years of education: None  . Highest education level: None  Social Needs  . Financial resource strain: None  . Food insecurity - worry: None  . Food insecurity - inability: None  . Transportation needs - medical: None  . Transportation needs - non-medical: None  Occupational History  . Occupation: [employer]  Tobacco Use  . Smoking status: Current Every Day Smoker    Packs/day: 0.50    Years: 40.00    Pack years: 20.00    Types: Cigarettes  . Smokeless tobacco: Never Used  Substance and Sexual Activity  . Alcohol use: Yes    Alcohol/week: 0.0 oz    Comment: "I just drink on the weekend"  . Drug use: No  . Sexual activity: None  Other Topics Concern  . None  Social History Narrative   Lives with wife    family history includes Cancer in his mother; Diabetes in his mother; Heart attack in his father; Heart failure in his mother; Hyperlipidemia in his father; Hypertension in his father and sister; Sleep apnea in his sister.  Wt Readings from Last 3 Encounters:  03/20/17 258 lb 9.6 oz (117.3 kg)  06/15/16 260 lb (117.9 kg)  12/02/15 260 lb (117.9 kg)    PHYSICAL EXAM BP 132/80   Pulse 92   Ht 5\' 9"  (1.753 m)   Wt 258 lb 9.6 oz (117.3 kg)   BMI 38.19 kg/m   Physical Exam  Constitutional: He is oriented to person, place, and time. He appears well-developed and well-nourished. No distress.  Well-groomed.  Borderline morbidly obese  HENT:  Head: Normocephalic and atraumatic.  Mouth/Throat: No oropharyngeal exudate.  Neck: Normal range of motion. No hepatojugular reflux and no JVD present. Carotid bruit is not present.  Cardiovascular: Regular rhythm, S1 normal and normal pulses.  Occasional extrasystoles are present. PMI is not displaced (Very difficult to palpate). Exam reveals distant heart sounds. Exam reveals no gallop and no friction  rub.  No murmur heard. Physiologically split S2  Pulmonary/Chest: Effort normal and breath sounds normal. No respiratory distress. He has no wheezes. He has no rales. He exhibits tenderness (On the upper sternal border radiating up.  Both clavicles).  Abdominal: Soft. Bowel sounds are normal. He exhibits no distension. There is no tenderness.  Obese abdomen  Musculoskeletal: Normal range of motion. He exhibits no edema (May be trivial).  Neurological: He is alert and oriented to person, place, and time.  Psychiatric: He has a normal mood and affect. His behavior is normal. Judgment and thought content normal.  Nursing note and vitals reviewed.     Adult ECG Report  Rate: 92 ;  Rhythm: normal sinus rhythm and RBBB.  Left axis deviation (-42).  Otherwise normal intervals and durations.;   Narrative Interpretation: Stable EKG   Other studies Reviewed: Additional studies/ records that were reviewed today include:  Recent Labs: No new labs available --  did  not get labs checked from last visit. -No longer has PCP Lab Results  Component Value Date   CREATININE 1.1 02/19/2014   BUN 18 02/19/2014   NA 138 02/19/2014   K 4.0 02/19/2014   CL 109 02/19/2014   CO2 25 02/19/2014    ASSESSMENT / PLAN: Problem List Items Addressed This Visit    Essential hypertension - Primary (Chronic)    Blood pressure is actually looks pretty good today.  At this point I would like to try to get him on some more cardiac protective medications, especially in light of his palpitations and history of VT.  Plan: Start Coreg 6.25 mg twice daily and reduce hydralazine dose by 1/2-25 mg twice daily. Continue amlodipine. Take additional 25 mg or 50 mg) hydralazine for SBP greater than 160 mmHg  With the thought of possibly using ACE inhibitor/ARB.  Will check CMP panel      Relevant Medications   carvedilol (COREG) 6.25 MG tablet   hydrALAZINE (APRESOLINE) 25 MG tablet   Hyperglycemia (Chronic)    Previous  history of hyperglycemia.  He certainly has the body habitus for metabolic syndrome.    We will check a hemoglobin A1c along with CMP --if he does have a combination of hyperglycemia, high triglycerides/low HDL, and obesity along with hypertension, he would meet criteria for metabolic syndrome which would require more aggressive lipid management and blood pressure control.      Relevant Orders   Comprehensive metabolic panel   Hemoglobin A1c   Hyperlipidemia (Chronic)    Given his risk factors, goal LDL will be less than 100.  I have not seen labs checked in over 3 years. Plan: Check fasting lipid panel along with A1c (2 assess for possible prediabetes).  Discussed dietary modification.  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  Remains on Spironolactone along with amlodipine and now beta-blocker along with hydralazine.  Check follow-up CMP to assess renal function and electrolytes.      Relevant Medications   carvedilol (COREG) 6.25 MG tablet   hydrALAZINE (APRESOLINE) 25 MG tablet   Hypokalemia (Chronic)   Relevant Orders   Comprehensive metabolic panel   Moderate obesity (Chronic)    Again, associated with possible metabolic syndrome.  We discussed the importance of diet and exercise and weight loss.      Thyroid nodule    Check TSH      Relevant Medications   carvedilol (COREG) 6.25 MG tablet   Other Relevant Orders   TSH   Tobacco abuse counseling    Still smoking close to a pack a day.  Unfortunately that was not an issue that he was all that interested in discussing initially, but now has talked about it.  We discussed potential options and he seemed to be  willing to consider the 14 mg NicoDerm patches -would do 2 weeks of 40 mg and then wean down to 7 mg for the next 2 weeks If that does not prove effective, we can consider Chantix.  5 minutes spent.      VT (ventricular tachycardia) (HCC) (Chronic)    As far as I can tell, no  recurrent episodes.  Not overly symptomatic, but however he does have some palpitations.  Had a negative ischemic evaluation and relative normal echocardiogram of the time. Plan: Initiate beta-blocker with carvedilol 6.25 mg twice daily.      Relevant Medications   carvedilol (COREG) 6.25 MG tablet   hydrALAZINE (APRESOLINE) 25 MG tablet   Other Relevant Orders   TSH      Current medicines are reviewed at length with the patient today. (+/- concerns) n/a The following changes have been made: see below  Patient Instructions  MEDICATION CHANGES  DECREASE HYDRALAZINE TO 25 MG  TABLET TWICE A DAY      MAY  USE HYDRALAZINE  50 MG  ( TAKE 2 TABLETS) AS NEEDED FOR BLOOD      PRESSURE GREATER THAN 160/? --TOP NUMBER OF BLOOD PRESSURE.   START CARVEDILOL ( COREG) 6.25 MG TWICE A DAY     LABS DO NOT EAT ORD DRINK THE MORNING OF THE TEST CMP LIPI CBC TSH HGBA1C MAY HAVE LABS DONE AT THIS OFFICE, NO APPOINTMENT NEEDED.    Your physician wants you to follow-up in 6 MONTHS WITH PA/NP AND 12 MONTHS WITH DR Kimiko Common.  You will receive a reminder letter in the mail two months in advance. If you don't receive a letter, please call our office to schedule the follow-up appointment.    Studies Ordered:   Orders Placed This Encounter  Procedures  . Lipid panel  . Comprehensive metabolic panel  . TSH  . Hemoglobin A1c     [name], M.D., M.S. Interventional Cardiologist   Pager # [phone] Phone # [phone] [address] [place], [place] [address]

## 2017-03-22 ENCOUNTER — Encounter: Payer: Self-pay | Admitting: Cardiology

## 2017-03-22 NOTE — Assessment & Plan Note (Signed)
Still smoking close to a pack a day.  Unfortunately that was not an issue that he was all that interested in discussing initially, but now has talked about it.  We discussed potential options and he seemed to be willing to consider the 14 mg NicoDerm patches -would do 2 weeks of 40 mg and then wean down to 7 mg for the next 2 weeks If that does not prove effective, we can consider Chantix.  5 minutes spent.

## 2017-03-22 NOTE — Assessment & Plan Note (Signed)
Thought to be related to primary hyperaldosteronism.  Remains on Spironolactone along with amlodipine and now beta-blocker along with hydralazine.  Check follow-up CMP to assess renal function and electrolytes.

## 2017-03-22 NOTE — Assessment & Plan Note (Signed)
Again, associated with possible metabolic syndrome.  We discussed the importance of diet and exercise and weight loss.

## 2017-03-22 NOTE — Assessment & Plan Note (Addendum)
Blood pressure is actually looks pretty good today.  At this point I would like to try to get him on some more cardiac protective medications, especially in light of his palpitations and history of VT.  Plan: Start Coreg 6.25 mg twice daily and reduce hydralazine dose by 1/2-25 mg twice daily. Continue amlodipine. Take additional 25 mg or 50 mg) hydralazine for SBP greater than 160 mmHg  With the thought of possibly using ACE inhibitor/ARB.  Will check CMP panel

## 2017-03-22 NOTE — Assessment & Plan Note (Signed)
Check TSH 

## 2017-03-22 NOTE — Assessment & Plan Note (Signed)
Previous history of hyperglycemia.  He certainly has the body habitus for metabolic syndrome.    We will check a hemoglobin A1c along with CMP --if he does have a combination of hyperglycemia, high triglycerides/low HDL, and obesity along with hypertension, he would meet criteria for metabolic syndrome which would require more aggressive lipid management and blood pressure control.

## 2017-03-22 NOTE — Assessment & Plan Note (Signed)
As far as I can tell, no recurrent episodes.  Not overly symptomatic, but however he does have some palpitations.  Had a negative ischemic evaluation and relative normal echocardiogram of the time. Plan: Initiate beta-blocker with carvedilol 6.25 mg twice daily.

## 2017-03-22 NOTE — Assessment & Plan Note (Signed)
Given his risk factors, goal LDL will be less than 100.  I have not seen labs checked in over 3 years. Plan: Check fasting lipid panel along with A1c (2 assess for possible prediabetes).  Discussed dietary modification.  I would have low threshold for starting him on a statin

## 2017-04-05 DIAGNOSIS — E782 Mixed hyperlipidemia: Secondary | ICD-10-CM | POA: Diagnosis not present

## 2017-04-05 DIAGNOSIS — E876 Hypokalemia: Secondary | ICD-10-CM | POA: Diagnosis not present

## 2017-04-05 DIAGNOSIS — E041 Nontoxic single thyroid nodule: Secondary | ICD-10-CM | POA: Diagnosis not present

## 2017-04-05 DIAGNOSIS — R739 Hyperglycemia, unspecified: Secondary | ICD-10-CM | POA: Diagnosis not present

## 2017-04-06 LAB — COMPREHENSIVE METABOLIC PANEL
ALK PHOS: 90 IU/L (ref 39–117)
ALT: 16 IU/L (ref 0–44)
AST: 16 IU/L (ref 0–40)
Albumin/Globulin Ratio: 1.5 (ref 1.2–2.2)
Albumin: 4.3 g/dL (ref 3.6–4.8)
BUN/Creatinine Ratio: 12 (ref 10–24)
BUN: 11 mg/dL (ref 8–27)
Bilirubin Total: 0.3 mg/dL (ref 0.0–1.2)
CO2: 20 mmol/L (ref 20–29)
Calcium: 8.9 mg/dL (ref 8.6–10.2)
Chloride: 107 mmol/L — ABNORMAL HIGH (ref 96–106)
Creatinine, Ser: 0.9 mg/dL (ref 0.76–1.27)
GFR calc Af Amer: 107 mL/min/{1.73_m2} (ref >59)
GFR calc non Af Amer: 93 mL/min/{1.73_m2} (ref >59)
GLOBULIN, TOTAL: 2.9 g/dL (ref 1.5–4.5)
Glucose: 88 mg/dL (ref 65–99)
POTASSIUM: 4.3 mmol/L (ref 3.5–5.2)
SODIUM: 143 mmol/L (ref 134–144)
Total Protein: 7.2 g/dL (ref 6.0–8.5)

## 2017-04-06 LAB — LIPID PANEL
CHOL/HDL RATIO: 5.9 ratio — AB (ref 0.0–5.0)
CHOLESTEROL TOTAL: 236 mg/dL — AB (ref 100–199)
HDL: 40 mg/dL (ref >39)
LDL CALC: 150 mg/dL — AB (ref 0–99)
Triglycerides: 228 mg/dL — ABNORMAL HIGH (ref 0–149)
VLDL CHOLESTEROL CAL: 46 mg/dL — AB (ref 5–40)

## 2017-04-06 LAB — TSH: TSH: 0.66 u[IU]/mL (ref 0.450–4.500)

## 2017-04-06 LAB — HEMOGLOBIN A1C
Est. average glucose Bld gHb Est-mCnc: 123 mg/dL
HEMOGLOBIN A1C: 5.9 % — AB (ref 4.8–5.6)

## 2017-04-10 ENCOUNTER — Telehealth: Payer: Self-pay | Admitting: *Deleted

## 2017-04-10 MED ORDER — ROSUVASTATIN CALCIUM 40 MG PO TABS: 40.0000 mg | ORAL_TABLET | Freq: Every day | ORAL | 3 refills | Status: DC

## 2017-04-10 NOTE — Telephone Encounter (Signed)
Spoke to patient. Result given . Verbalized understanding E-SENT  MEDICATION TO [facility]  Per patient request ,ROSUVASTATIN 40 MG QHS-- 90 day supply x 3 refills

## 2017-04-10 NOTE — Telephone Encounter (Signed)
-----   Message from Marykay Lexavid W Harding, MD sent at 04/09/2017  8:18 PM EST ----- Unfortunately, your cholesterol levels are quite out of control. With levels as high as they are: Total cholesterol 236, LDL (bad cholesterol) 150 and triglycerides 228, I think we will probably need to start you on a medication.  Plan: Start rosuvastatin 40 mg daily  Bryan Lemmaavid Harding, MD

## 2017-11-20 DIAGNOSIS — I10 Essential (primary) hypertension: Secondary | ICD-10-CM | POA: Diagnosis not present

## 2017-11-20 DIAGNOSIS — H6691 Otitis media, unspecified, right ear: Secondary | ICD-10-CM | POA: Diagnosis not present

## 2017-11-20 DIAGNOSIS — E782 Mixed hyperlipidemia: Secondary | ICD-10-CM | POA: Diagnosis not present

## 2017-12-01 ENCOUNTER — Observation Stay (HOSPITAL_COMMUNITY): Payer: 59

## 2017-12-01 ENCOUNTER — Observation Stay (HOSPITAL_COMMUNITY)
Admission: EM | Admit: 2017-12-01 | Discharge: 2017-12-02 | Disposition: A | Discharge status: Home / Self Care | Payer: 59 | Attending: Cardiovascular Disease | Admitting: Cardiovascular Disease

## 2017-12-01 ENCOUNTER — Encounter (HOSPITAL_COMMUNITY): Payer: Self-pay | Admitting: Emergency Medicine

## 2017-12-01 ENCOUNTER — Other Ambulatory Visit: Payer: Self-pay

## 2017-12-01 ENCOUNTER — Emergency Department (HOSPITAL_COMMUNITY): Payer: 59

## 2017-12-01 DIAGNOSIS — F17210 Nicotine dependence, cigarettes, uncomplicated: Secondary | ICD-10-CM | POA: Insufficient documentation

## 2017-12-01 DIAGNOSIS — E669 Obesity, unspecified: Secondary | ICD-10-CM | POA: Insufficient documentation

## 2017-12-01 DIAGNOSIS — E7800 Pure hypercholesterolemia, unspecified: Secondary | ICD-10-CM

## 2017-12-01 DIAGNOSIS — E876 Hypokalemia: Secondary | ICD-10-CM | POA: Diagnosis present

## 2017-12-01 DIAGNOSIS — E785 Hyperlipidemia, unspecified: Secondary | ICD-10-CM | POA: Diagnosis present

## 2017-12-01 DIAGNOSIS — Z79899 Other long term (current) drug therapy: Secondary | ICD-10-CM | POA: Insufficient documentation

## 2017-12-01 DIAGNOSIS — I214 Non-ST elevation (NSTEMI) myocardial infarction: Secondary | ICD-10-CM

## 2017-12-01 DIAGNOSIS — Z7982 Long term (current) use of aspirin: Secondary | ICD-10-CM | POA: Diagnosis not present

## 2017-12-01 DIAGNOSIS — I10 Essential (primary) hypertension: Secondary | ICD-10-CM | POA: Insufficient documentation

## 2017-12-01 DIAGNOSIS — R0789 Other chest pain: Secondary | ICD-10-CM | POA: Diagnosis not present

## 2017-12-01 DIAGNOSIS — I152 Hypertension secondary to endocrine disorders: Secondary | ICD-10-CM | POA: Diagnosis present

## 2017-12-01 DIAGNOSIS — R079 Chest pain, unspecified: Principal | ICD-10-CM | POA: Insufficient documentation

## 2017-12-01 DIAGNOSIS — I4510 Unspecified right bundle-branch block: Secondary | ICD-10-CM | POA: Diagnosis not present

## 2017-12-01 LAB — CBC WITH DIFFERENTIAL/PLATELET
ABS IMMATURE GRANULOCYTES: 0.01 10*3/uL (ref 0.00–0.07)
Basophils Absolute: 0 10*3/uL (ref 0.0–0.1)
Basophils Relative: 0 %
Eosinophils Absolute: 0.2 10*3/uL (ref 0.0–0.5)
Eosinophils Relative: 2 %
HCT: 49.2 % (ref 39.0–52.0)
HEMOGLOBIN: 15.7 g/dL (ref 13.0–17.0)
Immature Granulocytes: 0 %
LYMPHS PCT: 22 %
Lymphs Abs: 1.5 10*3/uL (ref 0.7–4.0)
MCH: 29 pg (ref 26.0–34.0)
MCHC: 31.9 g/dL (ref 30.0–36.0)
MCV: 90.8 fL (ref 80.0–100.0)
MONO ABS: 0.6 10*3/uL (ref 0.1–1.0)
MONOS PCT: 9 %
NEUTROS ABS: 4.7 10*3/uL (ref 1.7–7.7)
Neutrophils Relative %: 67 %
Platelets: 208 10*3/uL (ref 150–400)
RBC: 5.42 MIL/uL (ref 4.22–5.81)
RDW: 14.8 % (ref 11.5–15.5)
WBC: 7 10*3/uL (ref 4.0–10.5)
nRBC: 0 % (ref 0.0–0.2)

## 2017-12-01 LAB — COMPREHENSIVE METABOLIC PANEL
ALBUMIN: 3.4 g/dL — AB (ref 3.5–5.0)
ALT: 17 U/L (ref 0–44)
AST: 22 U/L (ref 15–41)
Alkaline Phosphatase: 72 U/L (ref 38–126)
Anion gap: 5 (ref 5–15)
BILIRUBIN TOTAL: 1.1 mg/dL (ref 0.3–1.2)
BUN: 15 mg/dL (ref 6–20)
CHLORIDE: 110 mmol/L (ref 98–111)
CO2: 24 mmol/L (ref 22–32)
Calcium: 8.8 mg/dL — ABNORMAL LOW (ref 8.9–10.3)
Creatinine, Ser: 0.82 mg/dL (ref 0.61–1.24)
GFR calc Af Amer: >60 mL/min (ref >60)
GFR calc non Af Amer: >60 mL/min (ref >60)
GLUCOSE: 89 mg/dL (ref 70–99)
POTASSIUM: 4.2 mmol/L (ref 3.5–5.1)
Sodium: 139 mmol/L (ref 135–145)
TOTAL PROTEIN: 6.2 g/dL — AB (ref 6.5–8.1)

## 2017-12-01 LAB — CBC
HCT: 50.4 % (ref 39.0–52.0)
Hemoglobin: 16.7 g/dL (ref 13.0–17.0)
MCH: 30 pg (ref 26.0–34.0)
MCHC: 33.1 g/dL (ref 30.0–36.0)
MCV: 90.6 fL (ref 80.0–100.0)
PLATELETS: 216 10*3/uL (ref 150–400)
RBC: 5.56 MIL/uL (ref 4.22–5.81)
RDW: 14.9 % (ref 11.5–15.5)
WBC: 7.4 10*3/uL (ref 4.0–10.5)
nRBC: 0 % (ref 0.0–0.2)

## 2017-12-01 LAB — I-STAT TROPONIN, ED: TROPONIN I, POC: 0 ng/mL (ref 0.00–0.08)

## 2017-12-01 LAB — BRAIN NATRIURETIC PEPTIDE: B Natriuretic Peptide: 21.5 pg/mL (ref 0.0–100.0)

## 2017-12-01 LAB — CREATININE, SERUM
Creatinine, Ser: 0.91 mg/dL (ref 0.61–1.24)
GFR calc Af Amer: >60 mL/min (ref >60)
GFR calc non Af Amer: >60 mL/min (ref >60)

## 2017-12-01 MED ORDER — ASPIRIN EC 81 MG PO TBEC
81.0000 mg | DELAYED_RELEASE_TABLET | Freq: Every day | ORAL | Status: DC
  Administered 2017-12-02: 81 mg via ORAL
  Filled 2017-12-01: qty 1

## 2017-12-01 MED ORDER — ROSUVASTATIN CALCIUM 20 MG PO TABS
40.0000 mg | ORAL_TABLET | Freq: Every day | ORAL | Status: DC
  Administered 2017-12-02: 40 mg via ORAL
  Filled 2017-12-01: qty 2

## 2017-12-01 MED ORDER — VARENICLINE TARTRATE 0.5 MG X 11 & 1 MG X 42 PO MISC: 1.0000 | Freq: Two times a day (BID) | ORAL | Status: DC

## 2017-12-01 MED ORDER — HEPARIN SODIUM (PORCINE) 5000 UNIT/ML IJ SOLN
5000.0000 [IU] | Freq: Three times a day (TID) | INTRAMUSCULAR | Status: DC
  Administered 2017-12-02 (×2): 5000 [IU] via SUBCUTANEOUS
  Filled 2017-12-01 (×2): qty 1

## 2017-12-01 MED ORDER — AMOXICILLIN 500 MG PO CAPS
1000.0000 mg | ORAL_CAPSULE | Freq: Two times a day (BID) | ORAL | Status: DC
  Administered 2017-12-02 (×2): 1000 mg via ORAL
  Filled 2017-12-01 (×2): qty 2

## 2017-12-01 MED ORDER — METOPROLOL TARTRATE 12.5 MG HALF TABLET
12.5000 mg | ORAL_TABLET | Freq: Once | ORAL | Status: AC
  Administered 2017-12-01: 12.5 mg via ORAL
  Filled 2017-12-01: qty 1

## 2017-12-01 MED ORDER — NITROGLYCERIN 0.4 MG SL SUBL
0.4000 mg | SUBLINGUAL_TABLET | SUBLINGUAL | Status: DC | PRN
  Filled 2017-12-01 (×2): qty 1

## 2017-12-01 MED ORDER — VARENICLINE TARTRATE 1 MG PO TABS
1.0000 mg | ORAL_TABLET | Freq: Two times a day (BID) | ORAL | Status: DC
  Administered 2017-12-02 (×2): 1 mg via ORAL
  Filled 2017-12-01 (×2): qty 1

## 2017-12-01 MED ORDER — ONDANSETRON HCL 4 MG/2ML IJ SOLN: 4.0000 mg | Freq: Four times a day (QID) | INTRAMUSCULAR | Status: DC | PRN

## 2017-12-01 MED ORDER — HYDRALAZINE HCL 25 MG PO TABS
25.0000 mg | ORAL_TABLET | Freq: Two times a day (BID) | ORAL | Status: DC
  Administered 2017-12-02 (×2): 25 mg via ORAL
  Filled 2017-12-01 (×2): qty 1

## 2017-12-01 MED ORDER — CARVEDILOL 6.25 MG PO TABS: 6.2500 mg | ORAL_TABLET | Freq: Two times a day (BID) | ORAL | Status: DC

## 2017-12-01 MED ORDER — NITROGLYCERIN 0.4 MG SL SUBL
0.4000 mg | SUBLINGUAL_TABLET | Freq: Once | SUBLINGUAL | Status: AC
  Administered 2017-12-01: 0.4 mg via SUBLINGUAL
  Filled 2017-12-01: qty 1

## 2017-12-01 MED ORDER — CARVEDILOL 6.25 MG PO TABS
6.2500 mg | ORAL_TABLET | Freq: Two times a day (BID) | ORAL | Status: DC
  Administered 2017-12-01 – 2017-12-02 (×2): 6.25 mg via ORAL
  Filled 2017-12-01 (×2): qty 1

## 2017-12-01 MED ORDER — ACETAMINOPHEN 325 MG PO TABS: 650.0000 mg | ORAL_TABLET | ORAL | Status: DC | PRN

## 2017-12-01 MED ORDER — NITROGLYCERIN 0.4 MG SL SUBL: 0.4000 mg | SUBLINGUAL_TABLET | SUBLINGUAL | Status: DC | PRN

## 2017-12-01 MED ORDER — AMLODIPINE BESYLATE 10 MG PO TABS
10.0000 mg | ORAL_TABLET | Freq: Every day | ORAL | Status: DC
  Administered 2017-12-02: 10 mg via ORAL
  Filled 2017-12-01 (×2): qty 1

## 2017-12-01 MED ORDER — IOPAMIDOL (ISOVUE-370) INJECTION 76%
100.0000 mL | Freq: Once | INTRAVENOUS | Status: AC | PRN
  Administered 2017-12-01: 100 mL via INTRAVENOUS

## 2017-12-01 NOTE — ED Triage Notes (Signed)
Pt having 1/10 chest pain. Started last night 9/10 with pressure and went to left arm. Went to work 6/10 pain. Took 2 adult aspirins. EMS gave him nitro and pain improved. Endorses no other symptoms

## 2017-12-01 NOTE — ED Provider Notes (Signed)
[facility] EMERGENCY DEPARTMENT Provider Note   CSN: [id number] Arrival date & time: 12/01/17  0865     History   Chief Complaint Chief Complaint  Patient presents with  . Chest Pain    HPI Larry [name] is a 62 y.o. male with HTN, HLD, tobacco abuse who presents for evaluation of chest pain. It began last night after he got back in bed after using the bathroom. He reports being in his usual state of health yesterday and denies any previous history of chest pain. He describes the pain as a pressure in the center of his chest and noted associated left arm numbness and nausea. Denies diaphoresis, jaw pain, or back pain. His symptoms felt a little similar to his reflux and he did eat spaghetti for dinner yesterday. He was able to fall asleep and went to work the next morning but the chest pain persisted so he took 2 baby aspirin and came to the ED.   He is a current smoker, 1-1.5 ppd. Started Chantix last week.   Per chart review, he last saw his cardiologist in Feb 2019 and has history of occasional PVT and hypokalemia with possible hyperaldosteronism. Myoview in 2015 was unremarkable.   HPI  Past Medical History:  Diagnosis Date  . AR (allergic rhinitis)   . Hyperaldosteronism (HCC)   . Hyperglycemia 01/2014   A1c 6.0  . Hyperlipidemia   . Hypertension    Difficult to control - ? Secondary to Hyperaldosteronism.  Marland Kitchen Hypokalemia   . Rectal bleed 2001  . Thyroid nodule 01/2014    Patient Active Problem List   Diagnosis Date Noted  . Tobacco abuse counseling 06/15/2016  . Dizziness 12/02/2015  . Hyperaldosteronism (HCC)   . Moderate obesity 03/03/2014  . Hyperglycemia 01/30/2014  . Thyroid nodule 01/30/2014  . Hypertension due to endocrine disorder 01/30/2014    Class: Question of  . Abnormal nuclear cardiac imaging test   . Chest pain at rest   . Hypokalemia 01/29/2014  . Chest pain with minimal risk for cardiac etiology 01/28/2014  . VT  (ventricular tachycardia) (HCC) 01/28/2014  . Essential hypertension   . Hyperlipidemia     Past Surgical History:  Procedure Laterality Date  . COLONOSCOPY  2001  . ESOPHAGOGASTRODUODENOSCOPY  2001  . NM MYOVIEW LTD  December 2015   EF 50%, No ischemia or infarction  . SMALL BOWEL ENTEROSCOPY  2001        Home Medications    Prior to Admission medications   Medication Sig Start Date End Date Taking? Authorizing Provider  amLODipine (NORVASC) 10 MG tablet Take 1 tablet (10 mg total) by mouth daily. 06/15/16  Yes Marykay Lex, MD  amoxicillin (AMOXIL) 875 MG tablet Take 875 mg by mouth every 12 (twelve) hours. 11/20/17  Yes [provider]  aspirin 81 MG tablet Take 81 mg by mouth daily.   Yes [provider]  carvedilol (COREG) 6.25 MG tablet Take 1 tablet (6.25 mg total) by mouth 2 (two) times daily. 03/20/17  Yes Marykay Lex, MD  hydrALAZINE (APRESOLINE) 25 MG tablet Take 1 tablet (25 mg total) by mouth 2 (two) times daily. May take an extra 50 mg if blood pressure is greater than 160 as needed 03/20/17 12/01/17 Yes Marykay Lex, MD  potassium chloride SA (K-DUR,KLOR-CON) 20 MEQ tablet Take 2 tablets (40 mEq total) by mouth 3 (three) times daily. 06/15/16  Yes Marykay Lex, MD  rosuvastatin (CRESTOR) 40 MG  tablet Take 1 tablet (40 mg total) by mouth at bedtime. Patient taking differently: Take 40 mg by mouth daily.  04/10/17 12/01/17 Yes Marykay Lex, MD  varenicline (CHANTIX PAK) 0.5 MG X 11 & 1 MG X 42 tablet Take 0.5-2 mg by mouth See admin instructions. Take one 0.5 mg tablet by mouth once daily for 3 days, then increase to one 0.5 mg tablet twice daily for 4 days, then increase to one 1 mg tablet twice daily.   Yes [provider]  spironolactone (ALDACTONE) 25 MG tablet TAKE 1 TABLET (25 MG TOTAL) BY MOUTH DAILY. Patient not taking: Reported on 12/01/2017 06/15/16   Marykay Lex, MD    Family History Family History  Problem Relation  Age of Onset  . Cancer Mother   . Heart failure Mother   . Diabetes Mother   . Hypertension Father   . Hyperlipidemia Father   . Heart attack Father   . Sleep apnea Sister   . Hypertension Sister   . Thyroid disease Neg Hx     Social History Social History   Tobacco Use  . Smoking status: Current Every Day Smoker    Packs/day: 0.50    Years: 40.00    Pack years: 20.00    Types: Cigarettes  . Smokeless tobacco: Never Used  Substance Use Topics  . Alcohol use: Yes    Alcohol/week: 0.0 standard drinks    Comment: "I just drink on the weekend"  . Drug use: No     Allergies   Patient has no known allergies.   Review of Systems Review of Systems See HPI  Physical Exam Updated Vital Signs BP (!) 160/99   Pulse 67   Temp 98.4 F (36.9 C) (Oral)   Resp 19   Ht 5' 9.5" (1.765 m)   Wt 111.1 kg   SpO2 92%   BMI 35.66 kg/m   Physical Exam Vitals:   12/01/17 1115 12/01/17 1130 12/01/17 1200 12/01/17 1230  BP: 121/75 122/72 (!) 148/99 (!) 160/99  Pulse: 69 69 65 67  Resp: 14 16 17 19   Temp:      TempSrc:      SpO2: 97% 95% 94% 92%  Weight:      Height:       General: Vital signs reviewed.  Patient is well-developed and well-nourished, in no acute distress and cooperative with exam.  Neck: Supple, trachea midline, normal ROM, no JVD Cardiovascular: RRR, S1 normal, S2 normal, no murmurs, gallops, or rubs. Pulmonary/Chest: Clear to auscultation bilaterally, no wheezes, rales, or rhonchi. Abdominal: Soft, non-tender, non-distended, BS + Extremities: No lower extremity edema bilaterally,  pulses symmetric and intact bilaterally.  Neurological: A&O x3. Strength is normal and symmetric bilaterally, cranial nerve II-XII are grossly intact, no focal motor deficit, sensory intact to light touch bilaterally.  Skin: Warm, dry and intact. No rashes or erythema. Psychiatric: Normal mood and affect. speech and behavior is normal. Cognition and memory are mildly reduced.     ED Treatments / Results  Labs (all labs ordered are listed, but only abnormal results are displayed) Labs Reviewed  COMPREHENSIVE METABOLIC PANEL - Abnormal; Notable for the following components:      Result Value   Calcium 8.8 (*)    Total Protein 6.2 (*)    Albumin 3.4 (*)    All other components within normal limits  BRAIN NATRIURETIC PEPTIDE  CBC WITH DIFFERENTIAL/PLATELET  I-STAT TROPONIN, ED    EKG None  Radiology Dg Chest  2 View  Result Date: 12/01/2017 CLINICAL DATA:  Mid chest pain. EXAM: CHEST - 2 VIEW COMPARISON:  Chest x-ray dated January 28, 2014. FINDINGS: Heart at the upper limits of normal in size. Normal mediastinal contours. Normal pulmonary vascularity. No focal consolidation, pleural effusion, or pneumothorax. No acute osseous abnormality. IMPRESSION: No active cardiopulmonary disease. Electronically Signed   By: Obie Dredge M.D.   On: 12/01/2017 10:50    Procedures Procedures (including critical care time)  Medications Ordered in ED Medications  carvedilol (COREG) tablet 6.25 mg (has no administration in time range)  nitroGLYCERIN (NITROSTAT) SL tablet 0.4 mg (0.4 mg Sublingual Given 12/01/17 1304)     Initial Impression / Assessment and Plan / ED Course  I have reviewed the triage vital signs and the nursing notes.  Pertinent labs & imaging results that were available during my care of the patient were reviewed by me and considered in my medical decision making (see chart for details).   62yo male with significant ACS risk factors, HTN, HLD, tobacco use presenting with concerning chest pain. Differential includes ACS vs. GERD. Pain is not typical for aortic dissection or PE and he is mildly hypertensive but otherwise vital signs are normal. Doubt CHF, he sleeps on one pillow, denies orthopnea or DOE, no signs of volume overload on exam.   HEART score is 5. Will do workup to rule out ACS.   Initial troponin negative and EKG without ischemic  changes. He is endorsing 5/10 chest pain and having belching which makes the pain worse. Seems more consistent with indigestion but will try another sublingual nitro and consult cardiology.   Additional SL nitro has improved his chest pain.   Cardiology evaluated the patient and have decided to admit for further testing.   Final Clinical Impressions(s) / ED Diagnoses   Final diagnoses:  Chest pain, unspecified type    ED Discharge Orders    None       Ali Lowe, MD 12/01/17 1354    Mesner, Barbara Cower, MD 12/02/17 [phone]

## 2017-12-01 NOTE — H&P (Addendum)
Cardiology Admission History and Physical:   Patient ID: ADAL SERENO MRN: [id number]; DOB: 16-Jan-1956   Admission date: 12/01/2017  Primary Care Provider: Patient, No Pcp Per Primary Cardiologist: [name], MD   Chief Complaint: Upper chest pain  Patient Profile:   KENNEDY BOHANON is a 62 y.o. male with history of hypertension due to presumed primary hyperaldosteronism, hyperlipidemia, morbid obesity and ongoing tobacco smoking presented by EMS for evaluation of chest pain.  Low risk stress test in 2015.  Echocardiogram in 2015 showed LV function of 55 to 60% and grade 1 diastolic dysfunction.  He was doing well on cardiac standpoint when last seen by Dr. Herbie Baltimore February 2019.  History of Present Illness:   Mr. Kleinpeter  was in usual state of health up until 11 PM last night.  He woke up from sleep and went to bathroom as usual.  He drink water and may be choked. Then he had trouble swallowing.  Later he had upper sternal chest heaviness with some shortness of breath.  Denies radiation of pain, nausea or vomiting.  Might be similar to GERD.  He went to bed but could not get comfortable and finally fall asleep.  Pain recurred in the morning 6/10 and EMS was called.  Pain improved after sublingual nitroglycerin x1.  Since then he had intermittent short acting pain.  He continues to smoke 1 and half pack a day.  He denies palpitation, dizziness, syncope, orthopnea, PND, lower extremity edema or melena.  He was treated with antibiotic about 10 days ago for possible ear infection.  He still has symptoms of pressure in both ear with intermittent cough and congestion.  He has hoarseness.  No fever or chills.  No regular exercise but works for [employer].  On his feet all the time.  No exacerbating symptoms.  Electrolyte and serum creatinine within normal limit.  BNP normal.  Point-of-care troponin negative.  Hemoglobin normal.  Chest x-ray without acute cardiopulmonary disease.  EKG shows  normal sinus rhythm at rate of 66 bpm with chronic right bundle branch block.  Chest pain-free.    Past Medical History:  Diagnosis Date  . AR (allergic rhinitis)   . Hyperaldosteronism (HCC)   . Hyperglycemia 01/2014   A1c 6.0  . Hyperlipidemia   . Hypertension    Difficult to control - ? Secondary to Hyperaldosteronism.  Marland Kitchen Hypokalemia   . Rectal bleed 2001  . Thyroid nodule 01/2014    Past Surgical History:  Procedure Laterality Date  . COLONOSCOPY  2001  . ESOPHAGOGASTRODUODENOSCOPY  2001  . NM MYOVIEW LTD  December 2015   EF 50%, No ischemia or infarction  . SMALL BOWEL ENTEROSCOPY  2001     Medications Prior to Admission: Prior to Admission medications   Medication Sig Start Date End Date Taking? Authorizing Provider  amLODipine (NORVASC) 10 MG tablet Take 1 tablet (10 mg total) by mouth daily. 06/15/16  Yes Marykay Lex, MD  amoxicillin (AMOXIL) 875 MG tablet Take 875 mg by mouth every 12 (twelve) hours. 11/20/17  Yes [provider]  aspirin 81 MG tablet Take 81 mg by mouth daily.   Yes [provider]  carvedilol (COREG) 6.25 MG tablet Take 1 tablet (6.25 mg total) by mouth 2 (two) times daily. 03/20/17  Yes Marykay Lex, MD  hydrALAZINE (APRESOLINE) 25 MG tablet Take 1 tablet (25 mg total) by mouth 2 (two) times daily. May take an extra 50 mg if blood pressure is greater  than 160 as needed 03/20/17 12/01/17 Yes Marykay Lex, MD  potassium chloride SA (K-DUR,KLOR-CON) 20 MEQ tablet Take 2 tablets (40 mEq total) by mouth 3 (three) times daily. 06/15/16  Yes Marykay Lex, MD  rosuvastatin (CRESTOR) 40 MG tablet Take 1 tablet (40 mg total) by mouth at bedtime. Patient taking differently: Take 40 mg by mouth daily.  04/10/17 12/01/17 Yes Marykay Lex, MD  varenicline (CHANTIX PAK) 0.5 MG X 11 & 1 MG X 42 tablet Take 0.5-2 mg by mouth See admin instructions. Take one 0.5 mg tablet by mouth once daily for 3 days, then increase to one 0.5 mg tablet  twice daily for 4 days, then increase to one 1 mg tablet twice daily.   Yes [provider]  spironolactone (ALDACTONE) 25 MG tablet TAKE 1 TABLET (25 MG TOTAL) BY MOUTH DAILY. Patient not taking: Reported on 12/01/2017 06/15/16   Marykay Lex, MD     Allergies:   No Known Allergies  Social History:   Social History   Socioeconomic History  . Marital status: Married    Spouse name: Not on file  . Number of children: Not on file  . Years of education: Not on file  . Highest education level: Not on file  Occupational History  . 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 Financial resource strain: Not on file  . Food insecurity:    Worry: Not on file    Inability: Not on file  . Transportation needs:    Medical: Not on file    Non-medical: Not on file  Tobacco Use  . Smoking status: Current Every Day Smoker    Packs/day: 0.50    Years: 40.00    Pack years: 20.00    Types: Cigarettes  . Smokeless tobacco: Never Used  Substance and Sexual Activity  . Alcohol use: Yes    Alcohol/week: 0.0 standard drinks    Comment: "I just drink on the weekend"  . Drug use: No  . Sexual activity: Not on file  Lifestyle  . Physical activity:    Days per week: Not on file    Minutes per session: Not on file  . Stress: Not on file  Relationships  . Social connections:    Talks on phone: Not on file    Gets together: Not on file    Attends religious service: Not on file    Active member of club or organization: Not on file    Attends meetings of clubs or organizations: Not on file    Relationship status: Not on file  . Intimate partner violence:    Fear of current or ex partner: Not on file    Emotionally abused: Not on file    Physically abused: Not on file    Forced sexual activity: Not on file  Other Topics Concern  . Not on file  Social History Narrative   Lives with wife    Family History:  The patient's family history includes Cancer in his mother; Diabetes in his  mother; Heart attack in his father; Heart failure in his mother; Hyperlipidemia in his father; Hypertension in his father and sister; Sleep apnea in his sister. There is no history of Thyroid disease.    ROS:  Please see the history of present illness.  All other ROS reviewed and negative.     Physical Exam/Data:   Vitals:   12/01/17 1030 12/01/17 1045 12/01/17 1115 12/01/17 1130  BP: (!) 136/93 129/88  121/75 122/72  Pulse: 64 62 69 69  Resp: 14 20 14 16   Temp:      TempSrc:      SpO2: 96% 98% 97% 95%  Weight:      Height:       No intake or output data in the 24 hours ending 12/01/17 1257 Filed Weights   12/01/17 0938  Weight: 111.1 kg   Body mass index is 35.66 kg/m.  General: Obese male in no acute distress HEENT: normal Lymph: no adenopathy Neck: no  JVD Endocrine:  No thryomegaly Vascular: No carotid bruits; FA pulses 2+ bilaterally without bruits  Cardiac:  normal S1, S2; RRR; no murmur  Lungs:  clear to auscultation bilaterally, no wheezing, rhonchi or rales  Abd: soft, nontender, no hepatomegaly  Ext: no edema Musculoskeletal:  No deformities, BUE and BLE strength normal and equal Skin: warm and dry  Neuro:  CNs 2-12 intact, no focal abnormalities noted Psych:  Normal affect   Relevant CV Studies: As summarized above  Laboratory Data:  Chemistry Recent Labs  Lab 12/01/17 1010  NA 139  K 4.2  CL 110  CO2 24  GLUCOSE 89  BUN 15  CREATININE 0.82  CALCIUM 8.8*  GFRNONAA >60  GFRAA >60  ANIONGAP 5    Recent Labs  Lab 12/01/17 1010  PROT 6.2*  ALBUMIN 3.4*  AST 22  ALT 17  ALKPHOS 72  BILITOT 1.1   Hematology Recent Labs  Lab 12/01/17 1010  WBC 7.0  RBC 5.42  HGB 15.7  HCT 49.2  MCV 90.8  MCH 29.0  MCHC 31.9  RDW 14.8  PLT 208   Recent Labs  Lab 12/01/17 1024  TROPIPOC 0.00    BNP Recent Labs  Lab 12/01/17 1010  BNP 21.5    Radiology/Studies:  Dg Chest 2 View  Result Date: 12/01/2017 CLINICAL DATA:  Mid chest pain.  EXAM: CHEST - 2 VIEW COMPARISON:  Chest x-ray dated January 28, 2014. FINDINGS: Heart at the upper limits of normal in size. Normal mediastinal contours. Normal pulmonary vascularity. No focal consolidation, pleural effusion, or pneumothorax. No acute osseous abnormality. IMPRESSION: No active cardiopulmonary disease. Electronically Signed   By: Obie Dredge M.D.   On: 12/01/2017 10:50    Assessment and Plan:   1. Chest pain Seems atypical which started after questionable choking while drinking water. He treated with antibiotic for possible ear infection but no improvement.  He is still reports fluids shifts between bilateral ear.  Point-of-care troponin negative.  BNP normal.  Chest x-ray clear.  EKG without acute ischemic changes.  Suspect GI related pain as belching makes it works. He has no exertional symptoms. His cardiac risk factors includes HTN, HLD, tobacco smoking and obesity. His pain improved after SL in ER. Will plan Coronary CT today. Dr. Shirlee Latch or Dr. Delton See to ready in morning. Admit and rule out.   2.  Hypertension -Blood pressure elevated here.  Says normal at home.  He hasn't not took his medication today. Will resume home meds. He is not taking spironolactone for unknown period of time. Wife will bring meds for review tomorrow.  3.  Snoring/morbid obesity -Suspect untreated sleep apnea.  Consider sleep study as outpatient.  4.  Hyperlipidemia -Continue statin.   5. Chronic hypokalemia/hyperaldosterone - resume home K. As above.   6. Tobacco smoking - Resume home chantix  7. On going ear pain - Resume home abx  Severity of Illness: The appropriate patient status for this patient  is OBSERVATION. Observation status is judged to be reasonable and necessary in order to provide the required intensity of service to ensure the patient's safety. The patient's presenting symptoms, physical exam findings, and initial radiographic and laboratory data in the context of their  medical condition is felt to place them at decreased risk for further clinical deterioration. Furthermore, it is anticipated that the patient will be medically stable for discharge from the hospital within 2 midnights of admission. The following factors support the patient status of observation.   " The patient's presenting symptoms include Chest pain . " The physical exam findings include None  " The initial radiographic and laboratory data are Normal      For questions or updates, please contact [facility] Please consult [email or web address] for contact info under        Signed, Manson Passey, PA  12/01/2017 12:57 PM   Agree with note by Chelsea Aus PA-C  62 year old obese married African-American male presented with chest pain.  He is a patient Dr. Elissa Hefty.  He has positive risk factors including tobacco abuse, hypertension and hyperlipidemia.  He had a negative Myoview several years ago.  He developed chest pain last night after drinking some water that was waxing and waning all night.  He presented to the ER where his pain improved with supplemental glycerin.  He has a baseline right bundle branch block.  Enzymes are negative.  His exam is benign.  Given the fact that his pain improved with nitroglycerin I favor admission, coronary CTA to rule out an ischemic etiology.  If negative the patient can be discharged home in the morning on antireflux medications.   Runell Gess, M.D., FACP, [facility], [facility] [address] [place], [place]  [address]  [phone] 12/01/2017 2:24 PM

## 2017-12-02 DIAGNOSIS — E785 Hyperlipidemia, unspecified: Secondary | ICD-10-CM | POA: Diagnosis not present

## 2017-12-02 DIAGNOSIS — I10 Essential (primary) hypertension: Secondary | ICD-10-CM | POA: Diagnosis not present

## 2017-12-02 DIAGNOSIS — R079 Chest pain, unspecified: Secondary | ICD-10-CM | POA: Diagnosis not present

## 2017-12-02 DIAGNOSIS — E876 Hypokalemia: Secondary | ICD-10-CM | POA: Diagnosis not present

## 2017-12-02 LAB — LIPID PANEL
CHOL/HDL RATIO: 5.3 ratio
Cholesterol: 203 mg/dL — ABNORMAL HIGH (ref 0–200)
HDL: 38 mg/dL — AB (ref >40)
LDL Cholesterol: 144 mg/dL — ABNORMAL HIGH (ref 0–99)
Triglycerides: 104 mg/dL (ref <150)
VLDL: 21 mg/dL (ref 0–40)

## 2017-12-02 LAB — CBC
HCT: 49.2 % (ref 39.0–52.0)
Hemoglobin: 15.7 g/dL (ref 13.0–17.0)
MCH: 29 pg (ref 26.0–34.0)
MCHC: 31.9 g/dL (ref 30.0–36.0)
MCV: 90.8 fL (ref 80.0–100.0)
NRBC: 0 % (ref 0.0–0.2)
PLATELETS: 198 10*3/uL (ref 150–400)
RBC: 5.42 MIL/uL (ref 4.22–5.81)
RDW: 14.8 % (ref 11.5–15.5)
WBC: 6.4 10*3/uL (ref 4.0–10.5)

## 2017-12-02 LAB — BASIC METABOLIC PANEL
Anion gap: 11 (ref 5–15)
BUN: 14 mg/dL (ref 6–20)
CHLORIDE: 103 mmol/L (ref 98–111)
CO2: 25 mmol/L (ref 22–32)
Calcium: 8.5 mg/dL — ABNORMAL LOW (ref 8.9–10.3)
Creatinine, Ser: 0.83 mg/dL (ref 0.61–1.24)
GFR calc Af Amer: >60 mL/min (ref >60)
GFR calc non Af Amer: >60 mL/min (ref >60)
GLUCOSE: 100 mg/dL — AB (ref 70–99)
POTASSIUM: 3.5 mmol/L (ref 3.5–5.1)
SODIUM: 139 mmol/L (ref 135–145)

## 2017-12-02 LAB — HIV ANTIBODY (ROUTINE TESTING W REFLEX): HIV SCREEN 4TH GENERATION: NONREACTIVE

## 2017-12-02 MED ORDER — OMEPRAZOLE 20 MG PO CPDR: 20.0000 mg | DELAYED_RELEASE_CAPSULE | Freq: Two times a day (BID) | ORAL | 0 refills | Status: DC

## 2017-12-02 NOTE — Discharge Summary (Addendum)
Discharge Summary    Patient ID: Larry [name] MRN: [id number]; DOB: 1955-02-23  Admit date: 12/01/2017 Discharge date: 12/02/2017  Primary Care Provider: Patient, No Pcp Per  Primary Cardiologist: [name], MD  Primary Electrophysiologist:  None   Discharge Diagnoses    Principal Problem:   Chest pain with moderate risk for cardiac etiology Active Problems:   Essential hypertension   Hyperlipidemia   Hypokalemia   Hypertension due to endocrine disorder   Obesity   Allergies No Known Allergies  Diagnostic Studies/Procedures    CT coronary: preliminary read with nonobstructive disease Final read pending   History of Present Illness     Larry [name] is a 62 y.o. male with history of hypertension due to presumed primary hyperaldosteronism, hyperlipidemia, morbid obesity and ongoing tobacco smoking presented by EMS for evaluation of chest pain.  Low risk stress test in 2015.  Echocardiogram in 2015 showed LV function of 55 to 60% and grade 1 diastolic dysfunction. He was doing well on cardiac standpoint when last seen by Dr. Herbie Baltimore February 2019.  [name]  was in usual state of health up until 11 PM 11/30/17  He woke up from sleep and went to bathroom as usual.  He choked on water and then he had trouble swallowing.  Later he had upper sternal chest heaviness with some shortness of breath.  Denies radiation of pain, nausea or vomiting.  Might be similar to GERD.  He went to bed but could not get comfortable and finally fall asleep.  Pain recurred in the morning 6/10 and EMS was called.  Pain improved after sublingual nitroglycerin x1.  Since then he had intermittent short acting pain.  He continues to smoke 1 and half pack a day.  He denies palpitation, dizziness, syncope, orthopnea, PND, lower extremity edema or melena.  He was treated with antibiotic about 10 days ago for possible ear infection.  He still has symptoms of pressure in both ear with intermittent cough  and congestion.  He has hoarseness.  No fever or chills.  No regular exercise but works for [employer].  On his feet all the time.  No exacerbating symptoms.  Electrolyte and serum creatinine within normal limit.  BNP normal.  Point-of-care troponin negative.  Hemoglobin normal.  Chest x-ray without acute cardiopulmonary disease.  EKG shows normal sinus rhythm at rate of 66 bpm with chronic right bundle branch block.  Chest pain-free.  Hospital Course     Consultants: none  Given his symptoms with typical and atypical features, he was admitted to cardiology with plans for CT coronary. CT coronary completed and reviewed by Dr. Shirlee Latch - preliminary read with nonobstructive CAD.   Pt reports very brief episode of chest discomfort when he tried to lay flat to sleep last night. We discussed his ongoing smoking and the effects of nicotine on GERD, as well as GERD symptoms. He thinks his symptoms are most consistent with GERD. I recommended 20 mg Prilosec BID.    Pr discussed with Dr. Shirlee Latch and deemed stable for discharge.   _____________  Discharge Vitals Blood pressure (!) 144/94, pulse 76, temperature 98.2 F (36.8 C), temperature source Oral, resp. rate 20, height 5\' 9"  (1.753 m), weight 111 kg, SpO2 97 %.  Filed Weights   12/01/17 0938 12/01/17 1607 12/02/17 0454  Weight: 111.1 kg 110.9 kg 111 kg    Physical Exam  Constitutional: He is oriented to person, place, and time. He appears well-developed and well-nourished.  Neck: No  JVD present. No thyromegaly present.  Cardiovascular: Normal rate and regular rhythm. Exam reveals no friction rub.  Pulmonary/Chest: Effort normal.  Occasional wheezes in bases  Abdominal: Soft. Bowel sounds are normal.  Neurological: He is alert and oriented to person, place, and time.  Skin: Skin is warm and dry.  Psychiatric: He has a normal mood and affect.    Labs & Radiologic Studies    CBC Recent Labs    12/01/17 1010 12/01/17 1556  12/02/17 0531  WBC 7.0 7.4 6.4  NEUTROABS 4.7  --   --   HGB 15.7 16.7 15.7  HCT 49.2 50.4 49.2  MCV 90.8 90.6 90.8  PLT 208 216 198   Basic Metabolic Panel Recent Labs    16/10/96 1010 12/01/17 1556 12/02/17 0531  NA 139  --  139  K 4.2  --  3.5  CL 110  --  103  CO2 24  --  25  GLUCOSE 89  --  100*  BUN 15  --  14  CREATININE 0.82 0.91 0.83  CALCIUM 8.8*  --  8.5*   Liver Function Tests Recent Labs    12/01/17 1010  AST 22  ALT 17  ALKPHOS 72  BILITOT 1.1  PROT 6.2*  ALBUMIN 3.4*   No results for input(s): LIPASE, AMYLASE in the last 72 hours. Cardiac Enzymes No results for input(s): CKTOTAL, CKMB, CKMBINDEX, TROPONINI in the last 72 hours. BNP Invalid input(s): POCBNP D-Dimer No results for input(s): DDIMER in the last 72 hours. Hemoglobin A1C No results for input(s): HGBA1C in the last 72 hours. Fasting Lipid Panel Recent Labs    12/02/17 0531  CHOL 203*  HDL 38*  LDLCALC 144*  TRIG 104  CHOLHDL 5.3   Thyroid Function Tests No results for input(s): TSH, T4TOTAL, T3FREE, THYROIDAB in the last 72 hours.  Invalid input(s): FREET3 _____________  Dg Chest 2 View  Result Date: 12/01/2017 CLINICAL DATA:  Mid chest pain. EXAM: CHEST - 2 VIEW COMPARISON:  Chest x-ray dated January 28, 2014. FINDINGS: Heart at the upper limits of normal in size. Normal mediastinal contours. Normal pulmonary vascularity. No focal consolidation, pleural effusion, or pneumothorax. No acute osseous abnormality. IMPRESSION: No active cardiopulmonary disease. Electronically Signed   By: Obie Dredge M.D.   On: 12/01/2017 10:50   Ct Coronary Morph W/cta Cor W/score W/ca W/cm &/or Wo/cm  Result Date: 12/02/2017 CLINICAL DATA:  Chest pain EXAM: Cardiac CTA MEDICATIONS: Sub lingual nitro. 4mg  x 2 TECHNIQUE: The patient was scanned on a Siemens 192 slice scanner. Gantry rotation speed was 250 msecs. Collimation was 0.6 mm. A 100 kV prospective scan was triggered in the ascending  thoracic aorta at 35-75% of the R-R interval. Average HR during the scan was 60 bpm. The 3D data set was interpreted on a dedicated work station using MPR, MIP and VRT modes. A total of 80cc of contrast was used. FINDINGS: Non-cardiac: See separate report from [facility]. Pulmonary veins drain normally to the left atrium. Calcium Score: 38.6 Agatston units. Coronary Arteries: Right dominant with no anomalies LM: No plaque or stenosis. LAD system: No plaque or stenosis in LAD. There is a large D1 with mixed plaque, mild stenosis proximally. Circumflex system: No plaque or stenosis. RCA system: Calcified plaque proximal RCA, mild stenosis. IMPRESSION: 1. Coronary artery calcium score 38.6 Agatston units. This places the patient in the 54th percentile for age and gender, suggesting intermediate risk for future cardiac events. 2.  Nonobstructive mild CAD. [employer]  Electronically Signed   By: Marca Ancona M.D.   On: 12/02/2017 07:26   Disposition   Pt is being discharged home today in good condition.  Follow-up Plans & Appointments    Follow-up Information    Marykay Lex, MD Follow up in 3 week(s).   Specialty:  Cardiology Why:  Office will call with appt details Contact information: [address] [place] [address] [phone]          Discharge Instructions    Diet - low sodium heart healthy   Complete by:  As directed    Increase activity slowly   Complete by:  As directed       Discharge Medications   Allergies as of 12/02/2017   No Known Allergies     Medication List    TAKE these medications   amLODipine 10 MG tablet Commonly known as:  NORVASC Take 1 tablet (10 mg total) by mouth daily.   amoxicillin 875 MG tablet Commonly known as:  AMOXIL Take 875 mg by mouth every 12 (twelve) hours.   aspirin 81 MG tablet Take 81 mg by mouth daily.   carvedilol 6.25 MG tablet Commonly known as:  COREG Take 1 tablet (6.25 mg total) by  mouth 2 (two) times daily.   hydrALAZINE 25 MG tablet Commonly known as:  APRESOLINE Take 1 tablet (25 mg total) by mouth 2 (two) times daily. May take an extra 50 mg if blood pressure is greater than 160 as needed   omeprazole 20 MG capsule Commonly known as:  PRILOSEC Take 1 capsule (20 mg total) by mouth 2 (two) times daily.   potassium chloride SA 20 MEQ tablet Commonly known as:  K-DUR,KLOR-CON Take 2 tablets (40 mEq total) by mouth 3 (three) times daily.   rosuvastatin 40 MG tablet Commonly known as:  CRESTOR Take 1 tablet (40 mg total) by mouth at bedtime. What changed:  when to take this   spironolactone 25 MG tablet Commonly known as:  ALDACTONE TAKE 1 TABLET (25 MG TOTAL) BY MOUTH DAILY.   varenicline 0.5 MG X 11 & 1 MG X 42 tablet Commonly known as:  CHANTIX PAK Take 0.5-2 mg by mouth See admin instructions. Take one 0.5 mg tablet by mouth once daily for 3 days, then increase to one 0.5 mg tablet twice daily for 4 days, then increase to one 1 mg tablet twice daily.        Acute coronary syndrome (MI, NSTEMI, STEMI, etc) this admission?: No.    Outstanding Labs/Studies   Follow up with [facility] in 3-4 weeks.  Prilosec prescription given  Duration of Discharge Encounter   Greater than 30 minutes including physician time.  Signed, Roe Rutherford [employer], PA 12/02/2017, 8:17 AM   Patient examined chart reviewed No pain this am Obese black male no murmur lungs clear abdomen soft intact Peripheral pulses. Cardiac CT reviewed no obstructive disease and calcium score below average for age and sex D/c home  Charlton Haws

## 2017-12-25 ENCOUNTER — Ambulatory Visit (INDEPENDENT_AMBULATORY_CARE_PROVIDER_SITE_OTHER): Payer: 59 | Admitting: Adult Health

## 2017-12-25 ENCOUNTER — Encounter: Payer: Self-pay | Admitting: Adult Health

## 2017-12-25 VITALS — BP 126/80 | HR 93 | Ht 69.0 in | Wt 265.0 lb

## 2017-12-25 DIAGNOSIS — Z79899 Other long term (current) drug therapy: Secondary | ICD-10-CM

## 2017-12-25 DIAGNOSIS — I10 Essential (primary) hypertension: Secondary | ICD-10-CM | POA: Diagnosis not present

## 2017-12-25 DIAGNOSIS — E876 Hypokalemia: Secondary | ICD-10-CM

## 2017-12-25 DIAGNOSIS — E7800 Pure hypercholesterolemia, unspecified: Secondary | ICD-10-CM

## 2017-12-25 NOTE — Patient Instructions (Signed)
Medication Instructions:  MAKE SURE TO TAKE POTASSIUM AS DIRECTED  If you need a refill on your cardiac medications before your next appointment, please call your pharmacy.  Labwork: BMET.LFT AND LIPID-ON 03-16-18 OR 03-19-2018 HERE IN OUR OFFICE AT [facility]  Take the provided lab slips with you to the lab for your blood draw.   You will need to fast. DO NOT EAT OR DRINK PAST MIDNIGHT.   If you have labs (blood work) drawn today and your tests are completely normal, you will receive your results only by: Marland Kitchen MyChart Message (if you have MyChart) OR . A paper copy in the mail If you have any lab test that is abnormal or we need to change your treatment, we will call you to review the results.  Special Instructions: Please call PCP -OR- [facility] network for Physician (NEPHROLOGY) referral at: [phone] -or- Visit [email or web address].   Follow-Up: You will need a follow up appointment in 03-20-2018.   You may see  DR Selmer Dominion, DNP, ANP or one of the following Advanced Practice Providers on your designated Care Team:    . Joni Reining, DNP, ANP . Rhonda Barrett, PA-C  . Corine Shelter, [place]  . Azalee Course, PA-C . Micah Flesher, PA-C  At [facility], you and your health needs are our priority.  As part of our continuing mission to provide you with exceptional heart care, we have created designated Provider Care Teams.  These Care Teams include your primary Cardiologist (physician) and Advanced Practice Providers (APPs -  Physician Assistants and Nurse Practitioners) who all work together to provide you with the care you need, when you need it.  Thank you for choosing [facility] at [facility]!!

## 2017-12-25 NOTE — Progress Notes (Signed)
Cardiology Office Note   Date:  12/25/2017   ID:  Larry [name], DOB 29-May-1955, MRN [id number]  PCP:  Deatra James, MD  Cardiologist: Dr. Herbie Baltimore   Chief Complaint  Patient presents with  . Chest Pain     History of Present Illness: Larry [name] is a 62 y.o. male who presents for hospital follow up after admission for chest pain. Cardiac CT was completed and found non-obstructive CAD. He was diagnosed with GERD and recommended for Prilosec 20 mg BID. He is compliant with his medications. He has quit smoking since hospitalization but has gained some weight as a result.   He wants to be established with a new PCP. [facility]  Past Medical History:  Diagnosis Date  . AR (allergic rhinitis)   . Hyperaldosteronism (HCC)   . Hyperglycemia 01/2014   A1c 6.0  . Hyperlipidemia   . Hypertension    Difficult to control - ? Secondary to Hyperaldosteronism.  Marland Kitchen Hypokalemia   . Rectal bleed 2001  . Thyroid nodule 01/2014    Past Surgical History:  Procedure Laterality Date  . COLONOSCOPY  2001  . ESOPHAGOGASTRODUODENOSCOPY  2001  . NM MYOVIEW LTD  December 2015   EF 50%, No ischemia or infarction  . SMALL BOWEL ENTEROSCOPY  2001     Current Outpatient Medications  Medication Sig Dispense Refill  . amLODipine (NORVASC) 10 MG tablet Take 1 tablet (10 mg total) by mouth daily. 90 tablet 3  . amoxicillin (AMOXIL) 875 MG tablet Take 875 mg by mouth every 12 (twelve) hours.  0  . aspirin 81 MG tablet Take 81 mg by mouth daily.    . carvedilol (COREG) 6.25 MG tablet Take 1 tablet (6.25 mg total) by mouth 2 (two) times daily. 180 tablet 3  . hydrALAZINE (APRESOLINE) 25 MG tablet Take 1 tablet (25 mg total) by mouth 2 (two) times daily. May take an extra 50 mg if blood pressure is greater than 160 as needed 180 tablet 3  . omeprazole (PRILOSEC) 20 MG capsule Take 1 capsule (20 mg total) by mouth 2 (two) times daily. 60 capsule 0  . potassium  chloride SA (K-DUR,KLOR-CON) 20 MEQ tablet Take 2 tablets (40 mEq total) by mouth 3 (three) times daily. 540 tablet 3  . rosuvastatin (CRESTOR) 40 MG tablet Take 1 tablet (40 mg total) by mouth at bedtime. 90 tablet 3  . spironolactone (ALDACTONE) 25 MG tablet TAKE 1 TABLET (25 MG TOTAL) BY MOUTH DAILY. 90 tablet 3  . varenicline (CHANTIX PAK) 0.5 MG X 11 & 1 MG X 42 tablet Take 0.5-2 mg by mouth See admin instructions. Take one 0.5 mg tablet by mouth once daily for 3 days, then increase to one 0.5 mg tablet twice daily for 4 days, then increase to one 1 mg tablet twice daily.     No current facility-administered medications for this visit.     Allergies:   Patient has no known allergies.    Social History:  The patient  reports that he has been smoking cigarettes. He has a 20.00 pack-year smoking history. He has never used smokeless tobacco. He reports that he drinks alcohol. He reports that he does not use drugs.   Family History:  The patient's family history includes Cancer in his mother; Diabetes in his mother; Heart attack in his father; Heart failure in his mother; Hyperlipidemia in his father; Hypertension in his father and sister; Sleep apnea  in his sister.    ROS: All other systems are reviewed and negative. Unless otherwise mentioned in H&P    PHYSICAL EXAM: VS:  BP 126/80   Pulse 93   Ht 5\' 9"  (1.753 m)   Wt 265 lb (120.2 kg)   SpO2 95%   BMI 39.13 kg/m  , BMI Body mass index is 39.13 kg/m. GEN: Well nourished, well developed, in no acute distress. Obese HEENT: normal Neck: no JVD, carotid bruits, or masses Cardiac: RRR; no murmurs, rubs, or gallops,no edema  Respiratory:  Clear to auscultation bilaterally, normal work of breathing GI: soft, nontender, nondistended, + BS MS: no deformity or atrophy Skin: warm and dry, no rash Neuro:  Strength and sensation are intact Psych: euthymic mood, full affect   EKG:  Not completed this office visit.   Recent  Labs: 04/05/2017: TSH 0.660 12/01/2017: ALT 17; B Natriuretic Peptide 21.5 12/02/2017: BUN 14; Creatinine, Ser 0.83; Hemoglobin 15.7; Platelets 198; Potassium 3.5; Sodium 139    Lipid Panel    Component Value Date/Time   CHOL 203 (H) 12/02/2017 0531   CHOL 236 (H) 04/05/2017 1225   TRIG 104 12/02/2017 0531   HDL 38 (L) 12/02/2017 0531   HDL 40 04/05/2017 1225   CHOLHDL 5.3 12/02/2017 0531   VLDL 21 12/02/2017 0531   LDLCALC 144 (H) 12/02/2017 0531   LDLCALC 150 (H) 04/05/2017 1225   LDLDIRECT 112.0 02/04/2014 0955      Wt Readings from Last 3 Encounters:  12/25/17 265 lb (120.2 kg)  12/02/17 244 lb 11.2 oz (111 kg)  03/20/17 258 lb 9.6 oz (117.3 kg)      Other studies Reviewed: CTA IMPRESSION: 1. Right middle lobe 4 mm solid pulmonary nodule. No follow-up needed if patient is low-risk. Non-contrast chest CT can be considered in 12 months if patient is high-risk. This recommendation follows the consensus statement: Guidelines for Management of Incidental Pulmonary Nodules Detected on CT Images:From the Fleischner Society 2017; published online before print (10.1148/radiol.[id number]). 2. Ectatic 4.2 cm ascending thoracic aorta. Recommend annual imaging followup by CTA or MRA. This recommendation follows 2010 ACCF/AHA/AATS/ACR/ASA/SCA/SCAI/SIR/STS/SVM Guidelines for the Diagnosis and Management of Patients with Thoracic Aortic Disease. Circulation. 2010; 121: U981-X914. 3.  Aortic Atherosclerosis (ICD10-I70.0).  Coronaries  Non-cardiac: See separate report from [facility].  Pulmonary veins drain normally to the left atrium.  Calcium Score: 38.6 Agatston units.  Coronary Arteries: Right dominant with no anomalies  LM: No plaque or stenosis.  LAD system: No plaque or stenosis in LAD. There is a large D1 with mixed plaque, mild stenosis proximally.  Circumflex system: No plaque or stenosis.  RCA system: Calcified plaque proximal RCA, mild  stenosis.  IMPRESSION: 1. Coronary artery calcium score 38.6 Agatston units. This places the patient in the 54th percentile for age and gender, suggesting intermediate risk for future cardiac events.  2.  Nonobstructive mild CAD.   ASSESSMENT AND PLAN:  1. Chest Pain: Non cardiac, now believed to be GERD. He is feeling better on PPI. CTA was negative for obstructive CAD:  2. Hypokalemia: He is to take his potassium TID as directed. He is only talking it twice a day. He wants to be referred to nephrology to find out why he is losing potassium. Will defer to PCP  3. Hypertension: His BP is well controlled.   4. Hypercholesterolemia: Continue Crestor. Will check fasting lipids and LFT's prior to follow up previously scheduled with Dr. Herbie Baltimore.   6. Tobacco abuse: He quit smoking and  is happy about that, except for weight gain. I have encouraged him to continue cessation and increase exercise.   Current medicines are reviewed at length with the patient today.    Labs/ tests ordered today include: BMET and Lipids and LFT's.   Bettey Mare. Liborio Nixon, ANP, AACC   12/25/2017 3:25 PM    [facility] [address] Office [phone] Fax [phone]

## 2018-03-02 ENCOUNTER — Other Ambulatory Visit: Payer: Self-pay | Admitting: Cardiology

## 2018-03-20 ENCOUNTER — Ambulatory Visit: Payer: 59 | Admitting: Cardiology

## 2018-03-22 ENCOUNTER — Encounter: Payer: Self-pay | Admitting: *Deleted

## 2018-10-05 ENCOUNTER — Other Ambulatory Visit: Payer: Self-pay

## 2018-10-05 MED ORDER — CARVEDILOL 6.25 MG PO TABS: 6.2500 mg | ORAL_TABLET | Freq: Two times a day (BID) | ORAL | 3 refills | Status: DC

## 2018-10-05 MED ORDER — HYDRALAZINE HCL 25 MG PO TABS: 25.0000 mg | ORAL_TABLET | Freq: Two times a day (BID) | ORAL | 3 refills | Status: DC

## 2018-11-05 ENCOUNTER — Telehealth: Payer: Self-pay | Admitting: Cardiology

## 2018-11-05 NOTE — Telephone Encounter (Signed)
Called patient, but, phone just rang.

## 2018-12-18 IMAGING — CT CT HEART MORP W/ CTA COR W/ SCORE W/ CA W/CM &/OR W/O CM
4 of 7 series · 8 of 20 positions shown, 9 images · IV contrast (APPLIED)
Comparison: Chest radiograph from earlier today.

EXAM:
OVER-READ INTERPRETATION  CT CHEST

The following report is an over-read performed by radiologist Dr.
does not include interpretation of cardiac or coronary anatomy or
pathology. The coronary CTA interpretation by the cardiologist is
attached.
CLINICAL DATA: Chest pain
Cardiac CTA
MEDICATIONS:
Sub lingual nitro. 4mg x 2
TECHNIQUE: The patient was scanned on a Siemens [REDACTED]ice scanner. Gantry
rotation speed was 250 msecs. Collimation was 0.6 mm. A 100 kV
prospective scan was triggered in the ascending thoracic aorta at
35-75% of the R-R interval. Average HR during the scan was 60 bpm.
The 3D data set was interpreted on a dedicated work station using
MPR, MIP and VRT modes. A total of 80cc of contrast was used.

[Series 6: best diast 76 % · axial · 0.37mm/px · z∈[+1126,+1177]mm · 2 of 388 slices shown, 3 images]
[im 130/388  vessel]
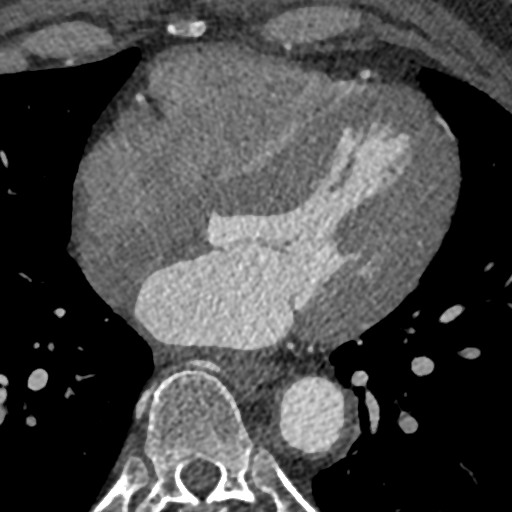
[im 130/388  lung]
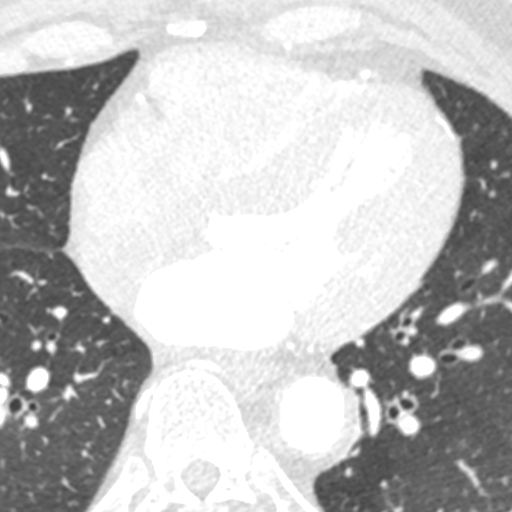
[im 259/388  vessel]
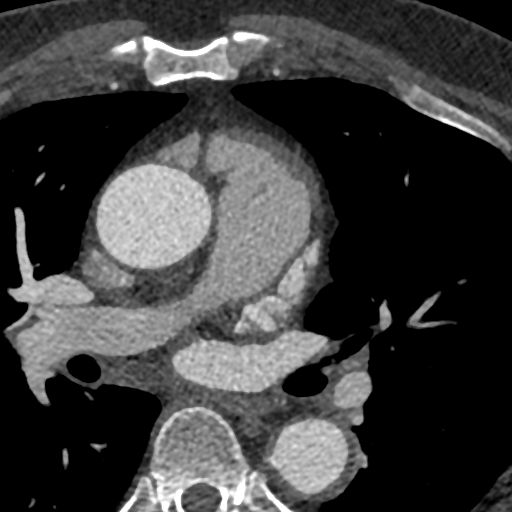

[Series 7: best syst 36 % · axial · 0.37mm/px · z∈[+1126,+1177]mm · 2 of 388 slices shown]
[im 130/388  vessel]
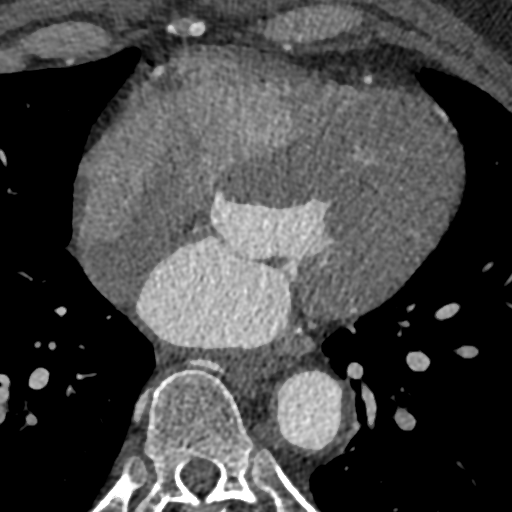
[im 259/388  vessel]
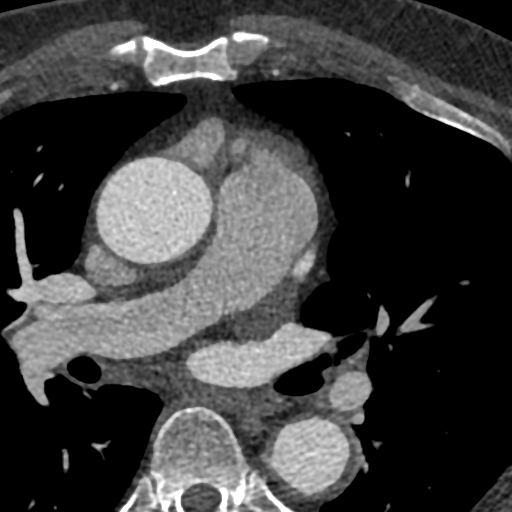

[Series 8: ts diast sharp 76 % · axial · 0.37mm/px · z∈[+1126,+1177]mm · 2 of 388 slices shown]
[im 130/388  lung]
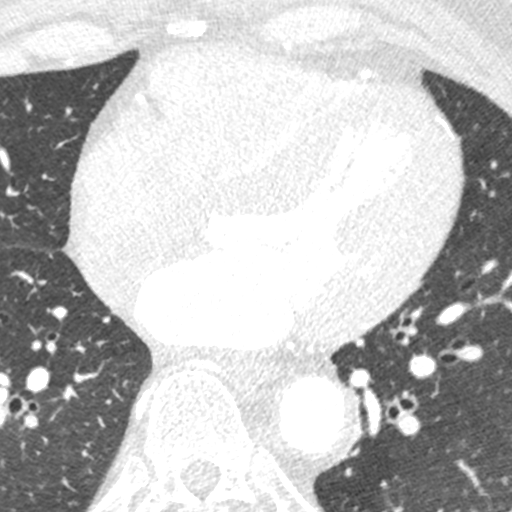
[im 259/388  lung]
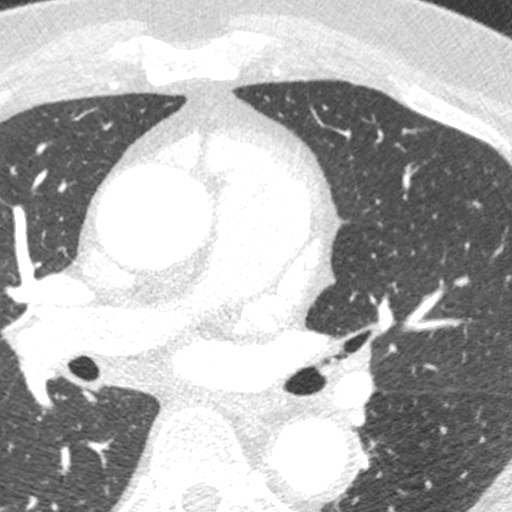

[Series 9: ts syst sharp 36 % · axial · 0.37mm/px · z∈[+1126,+1177]mm · 2 of 388 slices shown]
[im 130/388  lung]
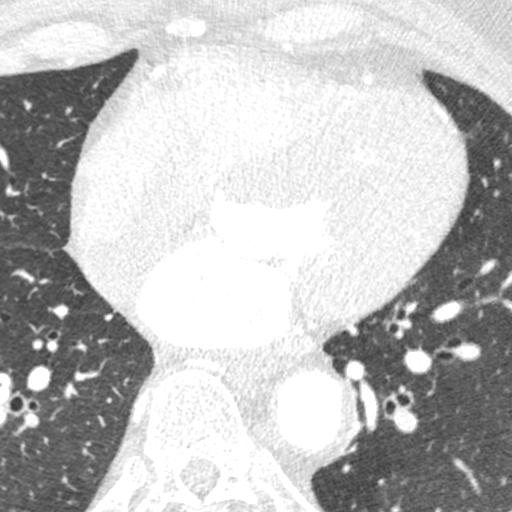
[im 259/388  lung]
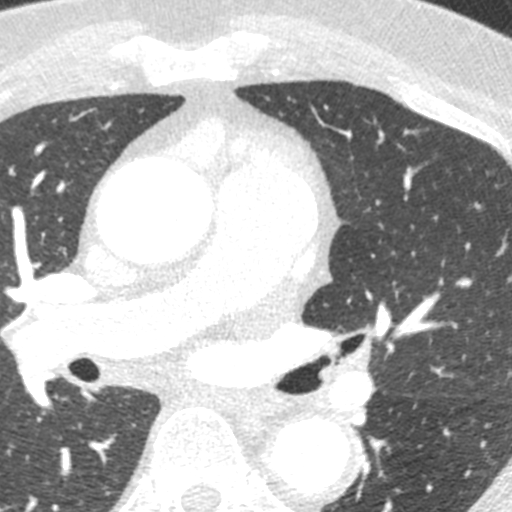

[8 of 20 positions shown; findings below may reference images not displayed]

FINDINGS: Cardiovascular: No significant pericardial effusion/thickening.
Atherosclerotic thoracic aorta with ectatic 4.2 cm ascending
thoracic aorta. Top-normal main pulmonary artery (3.3 cm diameter).
No central pulmonary emboli.

Mediastinum/Nodes: Unremarkable esophagus. No pathologically
enlarged mediastinal or hilar lymph nodes.

Lungs/Pleura: No pneumothorax. No pleural effusion. No acute
consolidative airspace disease or lung masses. Right middle lobe 4
mm solid pulmonary nodule (series 12/image 35).

Upper abdomen: No acute abnormality.

Musculoskeletal: No aggressive appearing focal osseous lesions.
Symmetric mild gynecomastia. Mild thoracic spondylosis.
IMPRESSION: 1. Right middle lobe 4 mm solid pulmonary nodule. No follow-up
needed if patient is low-risk. Non-contrast chest CT can be
considered in 12 months if patient is high-risk. This recommendation
follows the consensus statement: Guidelines for Management of
Incidental Pulmonary Nodules Detected on CT Images:From the
[HOSPITAL] 1915; published online before print
(10.1148/radiol.[id number]).
2. Ectatic 4.2 cm ascending thoracic aorta. Recommend annual imaging
followup by CTA or MRA. This recommendation follows 8707
ACCF/AHA/AATS/ACR/ASA/SCA/ELYSE/QUEEN/EEUWE/ZEUS Guidelines for the
Diagnosis and Management of Patients with Thoracic Aortic Disease.
Circulation. 8707; 121: e266-e369.
3.  Aortic Atherosclerosis (03OEY-0S4.4).
FINDINGS: Non-cardiac: See separate report from [REDACTED].

Pulmonary veins drain normally to the left atrium.

Calcium Score: 38.6 Agatston units.

Coronary Arteries: Right dominant with no anomalies

LM: No plaque or stenosis.

LAD system: No plaque or stenosis in LAD. There is a large D1 with
mixed plaque, mild stenosis proximally.

Circumflex system: No plaque or stenosis.

RCA system: Calcified plaque proximal RCA, mild stenosis.
IMPRESSION: 1. Coronary artery calcium score 38.6 Agatston units. This places
the patient in the 54th percentile for age and gender, suggesting
intermediate risk for future cardiac events.

2.  Nonobstructive mild CAD.

Marien Poli

## 2019-03-08 ENCOUNTER — Ambulatory Visit (INDEPENDENT_AMBULATORY_CARE_PROVIDER_SITE_OTHER): Payer: 59 | Admitting: Cardiology

## 2019-03-08 ENCOUNTER — Other Ambulatory Visit: Payer: Self-pay

## 2019-03-08 ENCOUNTER — Encounter: Payer: Self-pay | Admitting: Cardiology

## 2019-03-08 VITALS — BP 199/118 | HR 77 | Ht 70.0 in | Wt 269.8 lb

## 2019-03-08 DIAGNOSIS — E782 Mixed hyperlipidemia: Secondary | ICD-10-CM | POA: Diagnosis not present

## 2019-03-08 DIAGNOSIS — I10 Essential (primary) hypertension: Secondary | ICD-10-CM | POA: Diagnosis not present

## 2019-03-08 DIAGNOSIS — E876 Hypokalemia: Secondary | ICD-10-CM | POA: Diagnosis not present

## 2019-03-08 DIAGNOSIS — Z716 Tobacco abuse counseling: Secondary | ICD-10-CM

## 2019-03-08 DIAGNOSIS — R739 Hyperglycemia, unspecified: Secondary | ICD-10-CM

## 2019-03-08 MED ORDER — OMEPRAZOLE 20 MG PO CPDR: 20.0000 mg | DELAYED_RELEASE_CAPSULE | Freq: Two times a day (BID) | ORAL | 0 refills | Status: DC

## 2019-03-08 MED ORDER — AMLODIPINE BESYLATE 10 MG PO TABS: 10.0000 mg | ORAL_TABLET | Freq: Every day | ORAL | 3 refills | Status: DC

## 2019-03-08 MED ORDER — ROSUVASTATIN CALCIUM 40 MG PO TABS: 40.0000 mg | ORAL_TABLET | Freq: Every day | ORAL | 3 refills | Status: DC

## 2019-03-08 MED ORDER — SPIRONOLACTONE 25 MG PO TABS: ORAL_TABLET | ORAL | 3 refills | Status: DC

## 2019-03-08 NOTE — Progress Notes (Signed)
Primary Care Provider: Donald Prose, MD Cardiologist: Glenetta Hew, MD Electrophysiologist:   Clinic Note: Chief Complaint  Patient presents with  . Follow-up    1 year    HPI:    Larry MCCREADIE is a 64 y.o. male with a PMH Notable for Difficult Control Hypertension below who presents today for delayed annual follow-up.  ELHADJI PECORE was last seen in November 2019 by Jory Sims, NP-as a hospital follow-up for chest pain..  Was doing well at that time.  Cardiac CTA showed nonobstructive CAD.  He had quit smoking but gained some weight as a result.  (Blood pressure at that time was 126/80 mmHg).  Recent Hospitalizations: None  Reviewed  CV studies:    The following studies were reviewed today: (if available, images/films reviewed: From Epic Chart or Care Everywhere) . Coronary CTA October 2019: Coronary calcium score 38.  Mild proximal large D1.  Nonobstructive CAD.  Right dominant system. o Noncardiac finding 4 mm solitary pulmonary nodule.  Ectatic 4.2 cm ascending thoracic aorta with aortic atherosclerosis.   Interval History:   ESTANISLAO HARMON returns here today for follow-up a little bit on edge.  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  He has been doing all the wound care etc. for the last almost 2 years now.  Also within the last few months his sister passed away and he is not taking that very well.  He is has not taken his blood pressure medication yet today, and denies any headache or blurred vision.  No significant dyspnea either.  No chest pain or pressure with rest or exertion and no heart failure symptoms of PND, orthopnea or edema.  No real palpitations/irregular heartbeats.   CV Review of Symptoms (Summary): positive for - dyspnea on exertion, edema and DOE is mostly related to deconditioning. negative for - chest pain, irregular heartbeat,  orthopnea, palpitations, paroxysmal nocturnal dyspnea, rapid heart rate, shortness of breath or Syncope/near syncope, TIA/amaurosis fugax, claudication.  The patient does not have symptoms concerning for COVID-19 infection (fever, chills, cough, or new shortness of breath).  The patient is practicing social distancing and masking.   REVIEWED OF SYSTEMS   A comprehensive ROS was performed. Review of Systems  Constitutional: Negative for malaise/fatigue and weight loss.  HENT: Negative for congestion and nosebleeds.   Respiratory: Positive for shortness of breath.   Cardiovascular: Positive for leg swelling (But stable).  Gastrointestinal: Positive for blood in stool (Sometimes with wiping and sometimes notes little streaks in the stool.). Negative for nausea and vomiting.  Genitourinary: Negative for hematuria.  Musculoskeletal: Positive for joint pain and neck pain. Negative for falls.  Neurological: Positive for headaches (But not today). Negative for dizziness, focal weakness and weakness.  Psychiatric/Behavioral: Negative for depression and memory loss. The patient does not have insomnia.    I have reviewed and (if needed) personally updated the patient's problem list, medications, allergies, past medical and surgical history, social and family history.   PAST MEDICAL HISTORY   Past Medical History:  Diagnosis Date  . AR (allergic rhinitis)   . Hyperaldosteronism ([place])   . Hyperglycemia 01/2014   A1c 6.0  . Hyperlipidemia   . Hypertension    Difficult to control - ? Secondary to Hyperaldosteronism.  Marland Kitchen Hypokalemia   . Rectal bleed 2001  . Thyroid nodule 01/2014  PAST SURGICAL HISTORY   Past Surgical History:  Procedure Laterality Date  . COLONOSCOPY  2001  . ESOPHAGOGASTRODUODENOSCOPY  2001  . NM MYOVIEW LTD  December 2015   EF 50%, No ischemia or infarction  . SMALL BOWEL ENTEROSCOPY  2001     MEDICATIONS/ALLERGIES   Current Meds  Medication Sig  .  amLODipine (NORVASC) 10 MG tablet Take 1 tablet (10 mg total) by mouth daily.  Marland Kitchen aspirin 81 MG tablet Take 81 mg by mouth daily.  . carvedilol (COREG) 6.25 MG tablet Take 1 tablet (6.25 mg total) by mouth 2 (two) times daily.  . hydrALAZINE (APRESOLINE) 25 MG tablet Take 1 tablet (25 mg total) by mouth 2 (two) times daily. May take an extra 50 mg if blood pressure is greater than 160 as needed  . omeprazole (PRILOSEC) 20 MG capsule Take 1 capsule (20 mg total) by mouth 2 (two) times daily.  . potassium chloride SA (K-DUR,KLOR-CON) 20 MEQ tablet Take 2 tablets (40 mEq total) by mouth 3 (three) times daily.  . rosuvastatin (CRESTOR) 40 MG tablet Take 1 tablet (40 mg total) by mouth at bedtime.  Marland Kitchen spironolactone (ALDACTONE) 25 MG tablet TAKE 1 TABLET BY MOUTH EVERY DAY  . varenicline (CHANTIX PAK) 0.5 MG X 11 & 1 MG X 42 tablet Take 0.5-2 mg by mouth See admin instructions. Take one 0.5 mg tablet by mouth once daily for 3 days, then increase to one 0.5 mg tablet twice daily for 4 days, then increase to one 1 mg tablet twice daily.  . [DISCONTINUED] amLODipine (NORVASC) 10 MG tablet TAKE 1 TABLET BY MOUTH EVERY DAY  . [DISCONTINUED] omeprazole (PRILOSEC) 20 MG capsule Take 1 capsule (20 mg total) by mouth 2 (two) times daily.  . [DISCONTINUED] rosuvastatin (CRESTOR) 40 MG tablet Take 1 tablet (40 mg total) by mouth at bedtime.  . [DISCONTINUED] spironolactone (ALDACTONE) 25 MG tablet TAKE 1 TABLET BY MOUTH EVERY DAY    No Known Allergies   SOCIAL HISTORY/FAMILY HISTORY   Social History   Tobacco Use  . Smoking status: Current Every Day Smoker    Packs/day: 0.50    Years: 40.00    Pack years: 20.00    Types: Cigarettes  . Smokeless tobacco: Never Used  Substance Use Topics  . Alcohol use: Yes    Alcohol/week: 0.0 standard drinks    Comment: "I just drink on the weekend"  . Drug use: No   Social History   Social History Narrative   Lives with wife    Family History family history  includes Cancer in his mother; Diabetes in his mother; Heart attack in his father; Heart failure in his mother; Hyperlipidemia in his father; Hypertension in his father and sister; Sleep apnea in his sister.   OBJCTIVE -PE, EKG, labs   Wt Readings from Last 3 Encounters:  03/08/19 269 lb 12.8 oz (122.4 kg)  12/25/17 265 lb (120.2 kg)  12/02/17 244 lb 11.2 oz (111 kg)    Physical Exam: BP (!) 199/118   Pulse 77   Ht 5\' 10"  (1.778 m)   Wt 269 lb 12.8 oz (122.4 kg)   SpO2 97%   BMI 38.71 kg/m  Physical Exam  Constitutional: He is oriented to person, place, and time. He appears well-developed and well-nourished. No distress.  Borderline morbidly obese.  Well-groomed.  HENT:  Head: Normocephalic and atraumatic.  Neck: No JVD (Very difficult to assess) present.  Cardiovascular: Normal rate, regular rhythm, normal heart sounds and  intact distal pulses.  Pulmonary/Chest: Effort normal and breath sounds normal. No respiratory distress. He has no wheezes. He has no rales.  Distant breath sounds  Abdominal: Soft. Bowel sounds are normal. There is no abdominal tenderness. There is no rebound.  Musculoskeletal:        General: Edema (1-2+) present. Normal range of motion.     Cervical back: Normal range of motion and neck supple.  Neurological: He is alert and oriented to person, place, and time. No cranial nerve deficit.  Skin: Skin is warm and dry.  Psychiatric: He has a normal mood and affect. His behavior is normal. Judgment and thought content normal.  Vitals reviewed.   Adult ECG Report  Rate: 77;  Rhythm: normal sinus rhythm, premature ventricular contractions (PVC) and Left axis deviation and right bundle-branch block.;   Narrative Interpretation: Stable  Recent Labs:   Lab Results  Component Value Date   CHOL 203 (H) 12/02/2017   HDL 38 (L) 12/02/2017   LDLCALC 144 (H) 12/02/2017   LDLDIRECT 112.0 02/04/2014   TRIG 104 12/02/2017   CHOLHDL 5.3 12/02/2017   Lab Results    Component Value Date   CREATININE 0.83 12/02/2017   BUN 14 12/02/2017   NA 139 12/02/2017   K 3.5 12/02/2017   CL 103 12/02/2017   CO2 25 12/02/2017    ASSESSMENT/PLAN    Problem List Items Addressed This Visit    Essential hypertension - Primary (Chronic)    Pretty much accelerated hypertension today.  Very poorly controlled.  He is not sure if he took his medicines yet today. With obesity hyperlipidemia hyperglycemia, he meets criteria for metabolic syndrome.  Yes he has had relative normal coronary calcium score, we will need to be aggressive with blood pressure control especially with mild ascending aortic dilation.  Plan:  We will refill his prescriptions for amlodipine and spironolactone which has been out of for about 3 months now.  He will take amlodipine in the evening and spironolactone the morning.  She will he will take an extra dose of carvedilol tonight.  We will check a chemistry panel along with lipids and A1c in 2 weeks we will have him get his blood pressure check on that day.Marland Kitchen  He will then follow-up with Joni Reining, NP in 3 months then me in 9 months.      Relevant Medications   amLODipine (NORVASC) 10 MG tablet   spironolactone (ALDACTONE) 25 MG tablet   rosuvastatin (CRESTOR) 40 MG tablet   Other Relevant Orders   EKG 12-Lead   Lipid panel   Comprehensive metabolic panel   Hyperlipidemia (Chronic)    Most recent labs showed relatively controlled triglycerides but LDL of 144.  We need to refill his rosuvastatin as well.  Will check labs now and then potentially again time I see him back.      Relevant Medications   amLODipine (NORVASC) 10 MG tablet   spironolactone (ALDACTONE) 25 MG tablet   rosuvastatin (CRESTOR) 40 MG tablet   Other Relevant Orders   EKG 12-Lead   Lipid panel   Comprehensive metabolic panel   Hypokalemia (Chronic)    Question about possible hyperaldosteronism.  Is no longer on potassium supplementation but is on  spironolactone.  We will check his chemistry panels be started back aspirin spironolactone.   If potassium is low, low threshold for increasing spironolactone to 50 mg daily.      Hyperglycemia (Chronic)   Relevant Orders   Hemoglobin A1c  Morbid obesity (HCC)    The patient understands the need to lose weight with diet and exercise. We have discussed specific strategies for this.      Tobacco abuse counseling    We did discuss smoking cessation.  Not interested at this time.  Will discuss at later date.          COVID-19 Education: The signs and symptoms of COVID-19 were discussed with the patient and how to seek care for testing (follow up with PCP or arrange E-visit).   The importance of social distancing was discussed today.  I spent a total of with the patient and chart review. >  50% of the time was spent in direct patient consultation.  Additional time spent with chart review (studies, outside notes, etc): 6 Total Time: 28 min   Current medicines are reviewed at length with the patient today.  (+/- concerns) n/a   Patient Instructions / Medication Changes & Studies & Tests Ordered   Patient Instructions  Medication Instructions:   Take Amlodipine daily in the evening.  Take Spironolactone daily in the morning.  TAKE AN EXTRA DOSE OF CARVEDILOL AND HYDRALAZINE TONIGHT.  *If you need a refill on your cardiac medications before your next appointment, please call your pharmacy*  Lab Work: Your physician recommends that you return for a FASTING lipid profile, CMET, and Hemoglobin A1C in 2 weeks (around February 5th). You do not need an appointment for blood work done in our office.  If you have labs (blood work) drawn today and your tests are completely normal, you will receive your results only by: Marland Kitchen MyChart Message (if you have MyChart) OR . A paper copy in the mail If you have any lab test that is abnormal or we need to change your treatment, we will  call you to review the results.   Follow-Up: At [facility], you and your health needs are our priority.  As part of our continuing mission to provide you with exceptional heart care, we have created designated Provider Care Teams.  These Care Teams include your primary Cardiologist (physician) and Advanced Practice Providers (APPs -  Physician Assistants and Nurse Practitioners) who all work together to provide you with the care you need, when you need it.  Your next appointment:   3 month(s) with Bailey Mech, DNP  9 months with Dr. Herbie Baltimore  BP CHECK (Nurse visit or PharmD on day when Dr. Herbie Baltimore is in the office, next available)  The format for your next appointment:   In Person      Studies Ordered:   Orders Placed This Encounter  Procedures  . Lipid panel  . Comprehensive metabolic panel  . Hemoglobin A1c  . EKG 12-Lead     [name], M.D., M.S. Interventional Cardiologist   Pager # [phone] Phone # [phone] [address] [place], [place] [address]   Thank you for choosing Heartcare at [facility]!!

## 2019-03-08 NOTE — Patient Instructions (Signed)
Medication Instructions:   Take Amlodipine daily in the evening.  Take Spironolactone daily in the morning.  TAKE AN EXTRA DOSE OF CARVEDILOL AND HYDRALAZINE TONIGHT.  *If you need a refill on your cardiac medications before your next appointment, please call your pharmacy*  Lab Work: Your physician recommends that you return for a FASTING lipid profile, CMET, and Hemoglobin A1C in 2 weeks (around February 5th). You do not need an appointment for blood work done in our office.  If you have labs (blood work) drawn today and your tests are completely normal, you will receive your results only by: Marland Kitchen MyChart Message (if you have MyChart) OR . A paper copy in the mail If you have any lab test that is abnormal or we need to change your treatment, we will call you to review the results.   Follow-Up: At [facility], you and your health needs are our priority.  As part of our continuing mission to provide you with exceptional heart care, we have created designated Marry Kusch Care Teams.  These Care Teams include your primary Cardiologist (physician) and Advanced Practice Providers (APPs -  Physician Assistants and Nurse Practitioners) who all work together to provide you with the care you need, when you need it.  Your next appointment:   3 month(s) with Bailey Mech, DNP  9 months with Dr. Herbie Baltimore  BP CHECK (Nurse visit or PharmD on day when Dr. Herbie Baltimore is in the office, next available)  The format for your next appointment:   In Person

## 2019-03-10 ENCOUNTER — Encounter: Payer: Self-pay | Admitting: Cardiology

## 2019-03-10 NOTE — Assessment & Plan Note (Signed)
We did discuss smoking cessation.  Not interested at this time.  Will discuss at later date.

## 2019-03-10 NOTE — Assessment & Plan Note (Signed)
Question about possible hyperaldosteronism.  Is no longer on potassium supplementation but is on spironolactone.  We will check his chemistry panels be started back aspirin spironolactone.   If potassium is low, low threshold for increasing spironolactone to 50 mg daily.

## 2019-03-10 NOTE — Assessment & Plan Note (Signed)
The patient understands the need to lose weight with diet and exercise. We have discussed specific strategies for this.  

## 2019-03-10 NOTE — Assessment & Plan Note (Signed)
Pretty much accelerated hypertension today.  Very poorly controlled.  He is not sure if he took his medicines yet today. With obesity hyperlipidemia hyperglycemia, he meets criteria for metabolic syndrome.  Yes he has had relative normal coronary calcium score, we will need to be aggressive with blood pressure control especially with mild ascending aortic dilation.  Plan:  We will refill his prescriptions for amlodipine and spironolactone which has been out of for about 3 months now.  He will take amlodipine in the evening and spironolactone the morning.  She will he will take an extra dose of carvedilol tonight.  We will check a chemistry panel along with lipids and A1c in 2 weeks we will have him get his blood pressure check on that day.Marland Kitchen  He will then follow-up with Joni Reining, NP in 3 months then me in 9 months.

## 2019-03-10 NOTE — Assessment & Plan Note (Signed)
Most recent labs showed relatively controlled triglycerides but LDL of 144.  We need to refill his rosuvastatin as well.  Will check labs now and then potentially again time I see him back.

## 2019-03-12 ENCOUNTER — Ambulatory Visit: Payer: 59

## 2019-03-12 NOTE — Progress Notes (Deleted)
     03/12/2019 [name] Nov 24, 1955 [id number]   HPI:  Larry [name] is a 64 y.o. male patient of Dr Herbie Baltimore, with a PMH below who presents today for hypertension clinic evaluation.  His hypertension is believed to be secondary to hyperaldosteronism, which was diagnosed in 2015.  At that time was found to have an aldosterone/PRA ratio of 360 and a potassium down to 2.7.  He was started on amlodipine 10 mg and spironolactone 25 mg at that time.        Blood Pressure Goal:  130/80  Current Medications: amlodipine 10 mg qd, carvedilol 6.25 mg bid, hydralazine 25 mg bid, spironolactone 25 mg qd  Family Hx:  Social Hx:  Diet:  Exercise:  Home BP readings:  Intolerances:   Labs:  Wt Readings from Last 3 Encounters:  03/08/19 269 lb 12.8 oz (122.4 kg)  12/25/17 265 lb (120.2 kg)  12/02/17 244 lb 11.2 oz (111 kg)   BP Readings from Last 3 Encounters:  03/08/19 (!) 199/118  12/25/17 126/80  12/02/17 120/85   Pulse Readings from Last 3 Encounters:  03/08/19 77  12/25/17 93  12/02/17 76    Current Outpatient Medications  Medication Sig Dispense Refill  . amLODipine (NORVASC) 10 MG tablet Take 1 tablet (10 mg total) by mouth daily. 90 tablet 3  . aspirin 81 MG tablet Take 81 mg by mouth daily.    . carvedilol (COREG) 6.25 MG tablet Take 1 tablet (6.25 mg total) by mouth 2 (two) times daily. 180 tablet 3  . hydrALAZINE (APRESOLINE) 25 MG tablet Take 1 tablet (25 mg total) by mouth 2 (two) times daily. May take an extra 50 mg if blood pressure is greater than 160 as needed 180 tablet 3  . omeprazole (PRILOSEC) 20 MG capsule Take 1 capsule (20 mg total) by mouth 2 (two) times daily. 60 capsule 0  . potassium chloride SA (K-DUR,KLOR-CON) 20 MEQ tablet Take 2 tablets (40 mEq total) by mouth 3 (three) times daily. 540 tablet 3  . rosuvastatin (CRESTOR) 40 MG tablet Take 1 tablet (40 mg total) by mouth at bedtime. 90 tablet 3  . spironolactone (ALDACTONE) 25 MG tablet TAKE  1 TABLET BY MOUTH EVERY DAY 90 tablet 3  . varenicline (CHANTIX PAK) 0.5 MG X 11 & 1 MG X 42 tablet Take 0.5-2 mg by mouth See admin instructions. Take one 0.5 mg tablet by mouth once daily for 3 days, then increase to one 0.5 mg tablet twice daily for 4 days, then increase to one 1 mg tablet twice daily.     No current facility-administered medications for this visit.    No Known Allergies  Past Medical History:  Diagnosis Date  . AR (allergic rhinitis)   . Hyperaldosteronism (HCC)   . Hyperglycemia 01/2014   A1c 6.0  . Hyperlipidemia   . Hypertension    Difficult to control - ? Secondary to Hyperaldosteronism.  Marland Kitchen Hypokalemia   . Rectal bleed 2001  . Thyroid nodule 01/2014    There were no vitals taken for this visit.  No problem-specific Assessment & Plan notes found for this encounter.   Phillips Hay PharmD CPP [facility] [address] [place], [place] [address] [phone]

## 2019-03-13 ENCOUNTER — Encounter: Payer: Self-pay | Admitting: Cardiology

## 2019-03-13 LAB — LIPID PANEL
Chol/HDL Ratio: 4.9 ratio (ref 0.0–5.0)
Cholesterol, Total: 190 mg/dL (ref 100–199)
HDL: 39 mg/dL — ABNORMAL LOW (ref >39)
LDL Chol Calc (NIH): 113 mg/dL — ABNORMAL HIGH (ref 0–99)
Triglycerides: 217 mg/dL — ABNORMAL HIGH (ref 0–149)
VLDL Cholesterol Cal: 38 mg/dL (ref 5–40)

## 2019-03-13 LAB — COMPREHENSIVE METABOLIC PANEL
ALT: 15 IU/L (ref 0–44)
AST: 16 IU/L (ref 0–40)
Albumin/Globulin Ratio: 1.5 (ref 1.2–2.2)
Albumin: 4.2 g/dL (ref 3.8–4.8)
Alkaline Phosphatase: 102 IU/L (ref 39–117)
BUN/Creatinine Ratio: 13 (ref 10–24)
BUN: 13 mg/dL (ref 8–27)
Bilirubin Total: 0.5 mg/dL (ref 0.0–1.2)
CO2: 22 mmol/L (ref 20–29)
Calcium: 8.7 mg/dL (ref 8.6–10.2)
Chloride: 104 mmol/L (ref 96–106)
Creatinine, Ser: 0.97 mg/dL (ref 0.76–1.27)
GFR calc Af Amer: 96 mL/min/{1.73_m2} (ref >59)
GFR calc non Af Amer: 83 mL/min/{1.73_m2} (ref >59)
Globulin, Total: 2.8 g/dL (ref 1.5–4.5)
Glucose: 61 mg/dL — ABNORMAL LOW (ref 65–99)
Potassium: 3.8 mmol/L (ref 3.5–5.2)
Sodium: 144 mmol/L (ref 134–144)
Total Protein: 7 g/dL (ref 6.0–8.5)

## 2019-03-27 ENCOUNTER — Encounter: Payer: Self-pay | Admitting: *Deleted

## 2019-03-30 ENCOUNTER — Other Ambulatory Visit: Payer: Self-pay | Admitting: Cardiology

## 2019-04-01 ENCOUNTER — Other Ambulatory Visit: Payer: Self-pay | Admitting: Cardiology

## 2019-04-15 ENCOUNTER — Other Ambulatory Visit: Payer: Self-pay | Admitting: Cardiology

## 2019-08-29 ENCOUNTER — Ambulatory Visit: Payer: 59 | Attending: Critical Care Medicine

## 2019-08-29 DIAGNOSIS — Z23 Encounter for immunization: Secondary | ICD-10-CM

## 2019-08-29 NOTE — Progress Notes (Signed)
   Covid-19 Vaccination Clinic  Name:  MACHI WHITTAKER    MRN: [id number] DOB: 1955-12-18  08/29/2019  Mr. Bastyr was observed post Covid-19 immunization for 15 minutes without incident. He was provided with Vaccine Information Sheet and instruction to access the V-Safe system.   Mr. Morua was instructed to call 911 with any severe reactions post vaccine: Marland Kitchen Difficulty breathing  . Swelling of face and throat  . A fast heartbeat  . A bad rash all over body  . Dizziness and weakness   Immunizations Administered    Name Date Dose VIS Date Route   Moderna COVID-19 Vaccine 08/29/2019  5:27 PM 0.5 mL 01/2019 Intramuscular   Manufacturer: Moderna   Lot: [id number]   NDC: 16553-748-27

## 2019-09-26 ENCOUNTER — Ambulatory Visit: Payer: 59

## 2019-09-26 ENCOUNTER — Ambulatory Visit: Payer: 59 | Attending: Internal Medicine

## 2019-09-26 DIAGNOSIS — Z23 Encounter for immunization: Secondary | ICD-10-CM

## 2019-09-26 NOTE — Progress Notes (Signed)
   Covid-19 Vaccination Clinic  Name:  Larry [name]    MRN: [id number] DOB: 17-Feb-1955  09/26/2019  [name] was observed post Covid-19 immunization for 15 minutes without incident. He was provided with Vaccine Information Sheet and instruction to access the V-Safe system.   [name] was instructed to call 911 with any severe reactions post vaccine: Marland Kitchen Difficulty breathing  . Swelling of face and throat  . A fast heartbeat  . A bad rash all over body  . Dizziness and weakness   Immunizations Administered    Name Date Dose VIS Date Route   Moderna COVID-19 Vaccine 09/26/2019  2:01 PM 0.5 mL 01/2019 Intramuscular   Manufacturer: Moderna   Lot: [id number]   NDC: 25366-440-34

## 2019-10-10 ENCOUNTER — Telehealth: Payer: Self-pay | Admitting: Cardiology

## 2019-10-10 NOTE — Telephone Encounter (Signed)
LVM FOR PATIENT TO RETURN CALL TO GET FOLLOW SCHEDULED WITH HARDING FROM RECALL LIST

## 2019-10-27 ENCOUNTER — Other Ambulatory Visit: Payer: Self-pay | Admitting: Cardiology

## 2020-12-24 NOTE — Progress Notes (Signed)
Cardiology Office Note   Date:  12/25/2020   ID:  Larry [name], DOB 1955-09-30, MRN [id number]  PCP:  Needs one  Cardiologist:  Dr.Harding  CC: Be reestablished with cardiology.    History of Present Illness: Larry [name] is a 65 y.o. male who presents for we are following for ongoing assessment and management of difficult to control hypertension, chest discomfort.  Cardiac CTA revealed nonobstructive CAD in 2019.  At the last office visit with Dr. Herbie Baltimore dated 03/08/2019 the patient was doing fairly well, under a lot of stress taking care of his wife who had had some postoperative complications with her wounds.  Blood pressure was very elevated at that office visit, the patient did not taken his medications prior to coming to the office.  He was diagnosed with metabolic syndrome.  More aggressive blood pressure control was necessary.  He was advised to take amlodipine in the evening and spironolactone in the morning and was advised to take an extra dose of carvedilol just that evening post office visit that day.  Labs were ordered to check kidney status, along with hemoglobin A1c, and lipids and LFTs.  I have reviewed these labs which were dated 03/12/2019, he was found to have elevated triglycerides of 217, total cholesterol of 190 LDL of 113.  Chemistries were reviewed with potassium 3.8, creatinine 0.97, GFR 96.  Do not have a hemoglobin A1c level.  He comes today after 2-year hiatus.  His wife recently died of colon cancer 2022-07-28and he spent time caring for her and set up coming back for office appointments.  He is now rededicated himself to his health and therefore is beginning with cardiology.  He does not have a primary care provider.  He states that he has occasional dyspnea on exertion, occasional discomfort in his chest, but nothing severe.  He has not had lab work or any physical exam and almost 2 years.  He has run out of a good bit of his medications with the exception of HCTZ  and carvedilol but he does not always take it.  Past Medical History:  Diagnosis Date   AR (allergic rhinitis)    Hyperaldosteronism (HCC)    Hyperglycemia 01/2014   A1c 6.0   Hyperlipidemia    Hypertension    Difficult to control - ? Secondary to Hyperaldosteronism.   Hypokalemia    Rectal bleed 2001   Thyroid nodule 01/2014    Past Surgical History:  Procedure Laterality Date   COLONOSCOPY  2001   ESOPHAGOGASTRODUODENOSCOPY  2001   NM MYOVIEW LTD  December 2015   EF 50%, No ischemia or infarction   SMALL BOWEL ENTEROSCOPY  2001     Current Outpatient Medications  Medication Sig Dispense Refill   aspirin 81 MG tablet Take 81 mg by mouth daily.     omeprazole (PRILOSEC) 20 MG capsule TAKE 1 CAPSULE BY MOUTH TWICE A DAY 60 capsule 0   potassium chloride SA (K-DUR,KLOR-CON) 20 MEQ tablet Take 2 tablets (40 mEq total) by mouth 3 (three) times daily. 540 tablet 3   varenicline (CHANTIX PAK) 0.5 MG X 11 & 1 MG X 42 tablet Take 0.5-2 mg by mouth See admin instructions. Take one 0.5 mg tablet by mouth once daily for 3 days, then increase to one 0.5 mg tablet twice daily for 4 days, then increase to one 1 mg tablet twice daily.     amLODipine (NORVASC) 10 MG tablet Take 1 tablet (10 mg total) by  mouth daily. 30 tablet 03   carvedilol (COREG) 6.25 MG tablet Take 1 tablet (6.25 mg total) by mouth 2 (two) times daily. 60 tablet 6   hydrALAZINE (APRESOLINE) 25 MG tablet Take 1 tablet (25 mg total) by mouth 2 (two) times daily. May take an extra 50 mg if blood pressure is greater than 160 as needed 60 tablet 1   rosuvastatin (CRESTOR) 40 MG tablet Take 1 tablet (40 mg total) by mouth at bedtime. 30 tablet 3   spironolactone (ALDACTONE) 25 MG tablet TAKE 1 TABLET BY MOUTH EVERY DAY 30 tablet 3   No current facility-administered medications for this visit.    Allergies:   Patient has no known allergies.    Social History:  The patient  reports that he has been smoking cigarettes. He has  a 20.00 pack-year smoking history. He has never used smokeless tobacco. He reports current alcohol use. He reports that he does not use drugs.   Family History:  The patient's family history includes Cancer in his mother; Diabetes in his mother; Heart attack in his father; Heart failure in his mother; Hyperlipidemia in his father; Hypertension in his father and sister; Sleep apnea in his sister.    ROS: All other systems are reviewed and negative. Unless otherwise mentioned in H&P    PHYSICAL EXAM: VS:  BP (!) 150/80 (BP Location: Right Arm)   Pulse 78   Ht 5' 9.5" (1.765 m)   Wt 242 lb 3.2 oz (109.9 kg)   SpO2 96%   BMI 35.25 kg/m  , BMI Body mass index is 35.25 kg/m. GEN: Well nourished, well developed, in no acute distress, morbidly obese HEENT: normal Neck: no JVD, carotid bruits, or masses Cardiac: IRRR  no murmurs, rubs, or gallops,no edema  Respiratory: Inspiratory expiratory wheezes, frequent coughing, lungs do clear after deep cough. GI: soft, nontender, nondistended, + BS MS: no deformity or atrophy Skin: warm and dry, no rash Neuro:  Strength and sensation are intact Psych: euthymic mood, full affect   EKG:  EKG is ordered today. The ekg ordered today demonstrates personally reviewed) normal sinus rhythm with right bundle branch block, left anterior fascicular block with hypertrophy.  Rate of 80 bpm.  Compared to prior EKG 2020, bifascicular block and LVH is new   Recent Labs: No results found for requested labs within last 8760 hours.    Lipid Panel    Component Value Date/Time   CHOL 190 03/12/2019 1216   TRIG 217 (H) 03/12/2019 1216   HDL 39 (L) 03/12/2019 1216   CHOLHDL 4.9 03/12/2019 1216   CHOLHDL 5.3 12/02/2017 0531   VLDL 21 12/02/2017 0531   LDLCALC 113 (H) 03/12/2019 1216   LDLDIRECT 112.0 02/04/2014 0955      Wt Readings from Last 3 Encounters:  12/25/20 242 lb 3.2 oz (109.9 kg)  03/08/19 269 lb 12.8 oz (122.4 kg)  12/25/17 265 lb (120.2 kg)       Other studies Reviewed: Echocardiogram 02/24/14 - Left ventricle: The cavity size was normal. There was moderate    asymmetric hypertrophy. Systolic function was normal. The    estimated ejection fraction was in the range of 55% to 60%. Wall    motion was normal; there were no regional wall motion    abnormalities. Doppler parameters are consistent with abnormal    left ventricular relaxation (grade 1 diastolic dysfunction).  - Left atrium: The atrium was moderately to severely dilated.  - Atrial septum: No defect or patent foramen  ovale was identified  Cardiac CTA 12/01/2017 1. Coronary artery calcium score 38.6 Agatston units. This places the patient in the 54th percentile for age and gender, suggesting intermediate risk for future cardiac events.   2.  Nonobstructive mild CAD.   ASSESSMENT AND PLAN:  1.  Hypertension: I rechecked the patient's blood pressure in the exam room and found it to be 178/98.  He is currently on carvedilol 6.25 mg twice daily, hydralazine 50 mg 3 times daily.  I will add back amlodipine 10 mg daily.  We will likely need to's restart spironolactone.  I am going to repeat his echocardiogram due to changes in EKG and likely hypertensive heart disease.  We will be able to verify this better once we have echo results and this will direct management.  Checking BMET and CBC.  2. CAD: Seen on cardiac CTA in 2019.  Multiple cardiovascular risk factors which include ongoing tobacco abuse, obesity, hypercholesterolemia, hypertension, EtOH use, and family history.  May need to consider repeating cardiac CTA once echocardiogram is completed.  If there is a significant change in his LV function further recommendations will be made to include more invasive testing.  This is at the discretion of Dr. Herbie Baltimore.  3.  History of hypercholesterolemia: Currently not on any statin therapy.  Getting lipids and LFTs on this office visit.  We will likely place him on high  intensity atorvastatin however I want to check LFTs in the setting of increased alcohol use.  4.  Ongoing tobacco abuse: The patient smokes a pack of cigarettes a day and has been for over 20 years.  I have spoken with him about this briefly.  Frankly this office visit included a lot of issues for him to be reestablished in our practice.  Smoking cessation will be reinforced on next office visit.  5.  EtOH dependency: He continues to have alcohol every day up to 6 ounces (2 shot glasses of whiskey) and more so on the weekend while watching football games.  We will continue to address this on follow-up visits.  6.  Morbid obesity: He has not been taking care of himself very well over the last few years.  Would like to get him into an exercise program, even if is just walking daily.  Weight loss will be an important part of keeping him healthy.  May need to consider evaluation for OSA due to his body habitus with hypertension.   Current medicines are reviewed at length with the patient today.  I have spent 45 mins dedicated to the care of this patient on the date of this encounter to include pre-visit review of records, assessment, management and diagnostic testing,with shared decision making.  Labs/ tests ordered today include: Fasting lipids LFTs, BMET, hemoglobin A1c, TSH, PSA, CBC.  Echocardiogram.  Bettey Mare. Liborio Nixon, ANP, AACC   12/25/2020 5:24 PM    [facility] [address] Office [phone] Fax [phone]  Notice: This dictation was prepared with Dragon dictation along with smaller phrase technology. Any transcriptional errors that result from this process are unintentional and may not be corrected upon review.

## 2020-12-25 ENCOUNTER — Other Ambulatory Visit: Payer: Self-pay

## 2020-12-25 ENCOUNTER — Ambulatory Visit (INDEPENDENT_AMBULATORY_CARE_PROVIDER_SITE_OTHER): Payer: 59 | Admitting: Adult Health

## 2020-12-25 ENCOUNTER — Encounter: Payer: Self-pay | Admitting: Adult Health

## 2020-12-25 VITALS — BP 150/80 | HR 78 | Ht 69.5 in | Wt 242.2 lb

## 2020-12-25 DIAGNOSIS — F1020 Alcohol dependence, uncomplicated: Secondary | ICD-10-CM

## 2020-12-25 DIAGNOSIS — E782 Mixed hyperlipidemia: Secondary | ICD-10-CM | POA: Diagnosis not present

## 2020-12-25 DIAGNOSIS — I2510 Atherosclerotic heart disease of native coronary artery without angina pectoris: Secondary | ICD-10-CM

## 2020-12-25 DIAGNOSIS — Z79899 Other long term (current) drug therapy: Secondary | ICD-10-CM | POA: Diagnosis not present

## 2020-12-25 DIAGNOSIS — I10 Essential (primary) hypertension: Secondary | ICD-10-CM | POA: Diagnosis not present

## 2020-12-25 DIAGNOSIS — I2129 ST elevation (STEMI) myocardial infarction involving other sites: Secondary | ICD-10-CM

## 2020-12-25 DIAGNOSIS — E7800 Pure hypercholesterolemia, unspecified: Secondary | ICD-10-CM | POA: Diagnosis not present

## 2020-12-25 DIAGNOSIS — F1029 Alcohol dependence with unspecified alcohol-induced disorder: Secondary | ICD-10-CM

## 2020-12-25 HISTORY — DX: Alcohol dependence, uncomplicated: F10.20

## 2020-12-25 MED ORDER — SPIRONOLACTONE 25 MG PO TABS: ORAL_TABLET | ORAL | 3 refills | Status: DC

## 2020-12-25 MED ORDER — ROSUVASTATIN CALCIUM 40 MG PO TABS: 40.0000 mg | ORAL_TABLET | Freq: Every day | ORAL | 3 refills | Status: DC

## 2020-12-25 MED ORDER — AMLODIPINE BESYLATE 10 MG PO TABS: 10.0000 mg | ORAL_TABLET | Freq: Every day | ORAL | Status: DC

## 2020-12-25 MED ORDER — HYDRALAZINE HCL 25 MG PO TABS: 25.0000 mg | ORAL_TABLET | Freq: Two times a day (BID) | ORAL | 1 refills | Status: DC

## 2020-12-25 MED ORDER — CARVEDILOL 6.25 MG PO TABS: 6.2500 mg | ORAL_TABLET | Freq: Two times a day (BID) | ORAL | 6 refills | Status: DC

## 2020-12-25 NOTE — Patient Instructions (Signed)
Medication Instructions:  The current medical regimen is effective;  continue present plan and medications as directed. Please refer to the Current Medication list given to you today.  *If you need a refill on your cardiac medications before your next appointment, please call your pharmacy*  Lab Work: LIPID,LFT,CBC,BMET,A1C, TSH AND PSA TODAY If you have labs (blood work) drawn today and your tests are completely normal, you will receive your results only by:  MyChart Message (if you have MyChart) OR A paper copy in the mail.  If you have any lab test that is abnormal or we need to change your treatment, we will call you to review the results. You may go to any [facility] that is convenient for you however, we do have a lab in our office that is able to assist you. You DO NOT need an appointment for our lab. The lab is open 8:00am and closes at 4:00pm. Lunch 12:45 - 1:45pm.  Testing/Procedures: Echocardiogram - Your physician has requested that you have an echocardiogram. Echocardiography is a painless test that uses sound waves to create images of your heart. It provides your doctor with information about the size and shape of your heart and how well your heart's chambers and valves are working. This procedure takes approximately one hour. There are no restrictions for this procedure. This will be performed at either our [facility] location - [address]  -or- [facility] location [facility] 2nd floor.  Special Instructions PLEASE GET A PCP-SEE ATTACHED LISTS  PLEASE READ AND FOLLOW SALTY 6-ATTACHED-1,800 mg daily  PLEASE READ AND FOLLOW SMOKING CESSATION TIPS  PLEASE GET A PCP-SEE ATTACHED LISTS  Follow-Up: Your next appointment:  1 month(s) In Person with [name], MD  or Joni Reining, DNP, ANP    At [facility], you and your health needs are our priority.  As part of our continuing mission to provide you with exceptional heart care, we have created  designated Provider Care Teams.  These Care Teams include your primary Cardiologist (physician) and Advanced Practice Providers (APPs -  Physician Assistants and Nurse Practitioners) who all work together to provide you with the care you need, when you need it.  We recommend signing up for the patient portal called "MyChart".  Sign up information is provided on this After Visit Summary.  MyChart is used to connect with patients for Virtual Visits (Telemedicine).  Patients are able to view lab/test results, encounter notes, upcoming appointments, etc.  Non-urgent messages can be sent to your provider as well.   To learn more about what you can do with MyChart, go to [email or web address].           Steps to Quit Smoking Smoking tobacco is the leading cause of preventable death. It can affect almost every organ in the body. Smoking puts you and people around you at risk for many serious, long-lasting (chronic) diseases. Quitting smoking can be hard, but it is one of the best things that you can do for your health. It is never too late to quit. How do I get ready to quit? When you decide to quit smoking, make a plan to help you succeed. Before you quit: Pick a date to quit. Set a date within the next 2 weeks to give you time to prepare. Write down the reasons why you are quitting. Keep this list in places where you will see it often. Tell your family, friends, and co-workers that you are quitting. Their support is  important. Talk with your doctor about the choices that may help you quit. Find out if your health insurance will pay for these treatments. Know the people, places, things, and activities that make you want to smoke (triggers). Avoid them. What first steps can I take to quit smoking? Throw away all cigarettes at home, at work, and in your car. Throw away the things that you use when you smoke, such as ashtrays and lighters. Clean your car. Make sure to empty the ashtray. Clean your  home, including curtains and carpets. What can I do to help me quit smoking? Talk with your doctor about taking medicines and seeing a counselor at the same time. You are more likely to succeed when you do both. If you are pregnant or breastfeeding, talk with your doctor about counseling or other ways to quit smoking. Do not take medicine to help you quit smoking unless your doctor tells you to do so. To quit smoking: Quit right away Quit smoking totally, instead of slowly cutting back on how much you smoke over a period of time. Go to counseling. You are more likely to quit if you go to counseling sessions regularly. Take medicine You may take medicines to help you quit. Some medicines need a prescription, and some you can buy over-the-counter. Some medicines may contain a drug called nicotine to replace the nicotine in cigarettes. Medicines may: Help you to stop having the desire to smoke (cravings). Help to stop the problems that come when you stop smoking (withdrawal symptoms). Your doctor may ask you to use: Nicotine patches, gum, or lozenges. Nicotine inhalers or sprays. Non-nicotine medicine that is taken by mouth. Find resources Find resources and other ways to help you quit smoking and remain smoke-free after you quit. These resources are most helpful when you use them often. They include: Online chats with a Veterinary surgeon. Phone quitlines. Printed Materials engineer. Support groups or group counseling. Text messaging programs. Mobile phone apps. Use apps on your mobile phone or tablet that can help you stick to your quit plan. There are many free apps for mobile phones and tablets as well as websites. Examples include Quit Guide from the [employer] and smokefree.gov  What things can I do to make it easier to quit?  Talk to your family and friends. Ask them to support and encourage you. Call a phone quitline (1-800-QUIT-NOW), reach out to support groups, or work with a Veterinary surgeon. Ask  people who smoke to not smoke around you. Avoid places that make you want to smoke, such as: Bars. Parties. Smoke-break areas at work. Spend time with people who do not smoke. Lower the stress in your life. Stress can make you want to smoke. Try these things to help your stress: Getting regular exercise. Doing deep-breathing exercises. Doing yoga. Meditating. Doing a body scan. To do this, close your eyes, focus on one area of your body at a time from head to toe. Notice which parts of your body are tense. Try to relax the muscles in those areas. How will I feel when I quit smoking? Day 1 to 3 weeks Within the first 24 hours, you may start to have some problems that come from quitting tobacco. These problems are very bad 2-3 days after you quit, but they do not often last for more than 2-3 weeks. You may get these symptoms: Mood swings. Feeling restless, nervous, angry, or annoyed. Trouble concentrating. Dizziness. Strong desire for high-sugar foods and nicotine. Weight gain. Trouble pooping (constipation). Feeling  like you may vomit (nausea). Coughing or a sore throat. Changes in how the medicines that you take for other issues work in your body. Depression. Trouble sleeping (insomnia). Week 3 and afterward After the first 2-3 weeks of quitting, you may start to notice more positive results, such as: Better sense of smell and taste. Less coughing and sore throat. Slower heart rate. Lower blood pressure. Clearer skin. Better breathing. Fewer sick days. Quitting smoking can be hard. Do not give up if you fail the first time. Some people need to try a few times before they succeed. Do your best to stick to your quit plan, and talk with your doctor if you have any questions or concerns. Summary Smoking tobacco is the leading cause of preventable death. Quitting smoking can be hard, but it is one of the best things that you can do for your health. When you decide to quit smoking,  make a plan to help you succeed. Quit smoking right away, not slowly over a period of time. When you start quitting, seek help from your doctor, family, or friends. This information is not intended to replace advice given to you by your health care provider. Make sure you discuss any questions you have with your health care provider. Document Revised: 10/09/2020 Document Reviewed: 04/21/2018 Elsevier Patient Education  2022 [employer].

## 2020-12-26 LAB — BASIC METABOLIC PANEL
BUN/Creatinine Ratio: 18 (ref 10–24)
BUN: 19 mg/dL (ref 8–27)
CO2: 27 mmol/L (ref 20–29)
Calcium: 9.1 mg/dL (ref 8.6–10.2)
Chloride: 101 mmol/L (ref 96–106)
Creatinine, Ser: 1.07 mg/dL (ref 0.76–1.27)
Glucose: 91 mg/dL (ref 70–99)
Potassium: 3.1 mmol/L — ABNORMAL LOW (ref 3.5–5.2)
Sodium: 142 mmol/L (ref 134–144)
eGFR: 78 mL/min/{1.73_m2} (ref >59)

## 2020-12-26 LAB — LIPID PANEL
Chol/HDL Ratio: 4.3 ratio (ref 0.0–5.0)
Cholesterol, Total: 205 mg/dL — ABNORMAL HIGH (ref 100–199)
HDL: 48 mg/dL (ref >39)
LDL Chol Calc (NIH): 141 mg/dL — ABNORMAL HIGH (ref 0–99)
Triglycerides: 87 mg/dL (ref 0–149)
VLDL Cholesterol Cal: 16 mg/dL (ref 5–40)

## 2020-12-26 LAB — CBC
Hematocrit: 53.9 % — ABNORMAL HIGH (ref 37.5–51.0)
Hemoglobin: 18.4 g/dL — ABNORMAL HIGH (ref 13.0–17.7)
MCH: 30.4 pg (ref 26.6–33.0)
MCHC: 34.1 g/dL (ref 31.5–35.7)
MCV: 89 fL (ref 79–97)
Platelets: 222 10*3/uL (ref 150–450)
RBC: 6.05 x10E6/uL — ABNORMAL HIGH (ref 4.14–5.80)
RDW: 14.6 % (ref 11.6–15.4)
WBC: 6.4 10*3/uL (ref 3.4–10.8)

## 2020-12-26 LAB — TSH: TSH: 0.411 u[IU]/mL — ABNORMAL LOW (ref 0.450–4.500)

## 2020-12-26 LAB — HEPATIC FUNCTION PANEL
ALT: 17 IU/L (ref 0–44)
AST: 19 IU/L (ref 0–40)
Albumin: 4.1 g/dL (ref 3.8–4.8)
Alkaline Phosphatase: 95 IU/L (ref 44–121)
Bilirubin Total: 0.4 mg/dL (ref 0.0–1.2)
Bilirubin, Direct: 0.14 mg/dL (ref 0.00–0.40)
Total Protein: 6.6 g/dL (ref 6.0–8.5)

## 2020-12-26 LAB — PSA: Prostate Specific Ag, Serum: 0.6 ng/mL (ref 0.0–4.0)

## 2020-12-26 LAB — HEMOGLOBIN A1C
Est. average glucose Bld gHb Est-mCnc: 128 mg/dL
Hgb A1c MFr Bld: 6.1 % — ABNORMAL HIGH (ref 4.8–5.6)

## 2020-12-30 ENCOUNTER — Telehealth: Payer: Self-pay

## 2020-12-30 NOTE — Telephone Encounter (Addendum)
Called patient unable to leave voicemail.----- Message from Jodelle Gross, NP sent at 12/28/2020  7:09 PM EST ----- I have reviewed his labs. I started him back on potassium last visit. Please have BMET repeated and add T4 and T3, as his TSH is too low. He is pre-diabetic. He was referred to a PCP . Not sure if he has appointment. Awaiting Echocardiogram.   Larry [name]

## 2021-01-04 ENCOUNTER — Other Ambulatory Visit: Payer: Self-pay | Admitting: Adult Health

## 2021-01-04 ENCOUNTER — Telehealth: Payer: Self-pay

## 2021-01-04 NOTE — Addendum Note (Signed)
Addended by: Myna Hidalgo A on: 01/04/2021 01:31 PM   Modules accepted: Orders

## 2021-01-04 NOTE — Telephone Encounter (Addendum)
Attempted to call patient regarding results. Unable to leave message will mail letter out .----- Message from Jodelle Gross, NP sent at 12/28/2020  7:09 PM EST ----- I have reviewed his labs. I started him back on potassium last visit. Please have BMET repeated and add T4 and T3, as his TSH is too low. He is pre-diabetic. He was referred to a PCP . Not sure if he has appointment. Awaiting Echocardiogram.   Samara Deist

## 2021-01-12 ENCOUNTER — Other Ambulatory Visit (HOSPITAL_BASED_OUTPATIENT_CLINIC_OR_DEPARTMENT_OTHER): Payer: 59

## 2021-01-18 ENCOUNTER — Encounter (HOSPITAL_COMMUNITY): Payer: Self-pay | Admitting: Adult Health

## 2021-01-25 ENCOUNTER — Other Ambulatory Visit (HOSPITAL_COMMUNITY): Payer: 59

## 2021-01-28 NOTE — Progress Notes (Deleted)
Cardiology Office Note   Date:  01/28/2021   ID:  Larry [name], DOB 09/10/1955, MRN [id number]  PCP:  Deatra James, MD  Cardiologist: Dr. Herbie Baltimore No chief complaint on file.    History of Present Illness: Larry [name] is a 65 y.o. male who presents for  of difficult to control hypertension, chest discomfort.  Cardiac CTA revealed nonobstructive CAD in 2019.  At the last office visit with Dr. Herbie Baltimore dated 03/08/2019 the patient was doing fairly well, under a lot of stress taking care of his wife who had had some postoperative complications with her wounds.  He was last seen in the office on 12/25/2020 after a 2-year hiatus.  His wife died of colon cancer in 11-Aug-2022and now the patient is committed to improving his own health.  He did complain of occasional dyspnea on exertion and chest discomfort but nothing severe.  He had run out of his medications, and those that he did have (carvedilol and HCTZ) he was not taking consistently.)  On that office visit the patient was hypertensive with a blood pressure of 170/98.  I started him back on amlodipine 10 mg daily, he was to continue carvedilol 6.25 mg twice daily and hydralazine 50 mg 3 times a day.  Echocardiogram was repeated as 1 has not been done since 2015.  I also checked a BMET and a CBC.  He was found to be hypokalemic and therefore was started on potassium replacement.  I did review his cardiac CTA from 2019 which did not show a significant amount of coronary artery disease at that time, with a calcium score of 38.6 Agatston units.  This suggested an intermediate risk for future cardiovascular events.  Echocardiogram had not been completed.  As stated, his potassium revealed hypokalemia with a level of 3.1.  Total cholesterol was 205 LDL 141.  Hemoglobin A1c was 6.1.  He is here for follow-up to evaluate his response to medications concerning blood pressure control.  We will need to reorder echocardiogram as he did not complete it.  Past  Medical History:  Diagnosis Date   AR (allergic rhinitis)    EtOH dependence (HCC) 12/25/2020   Whiskey 6 oz each day and more on the weekends.    Hyperaldosteronism (HCC)    Hyperglycemia 01/2014   A1c 6.0   Hyperlipidemia    Hypertension    Difficult to control - ? Secondary to Hyperaldosteronism.   Hypokalemia    Rectal bleed 2001   Thyroid nodule 01/2014    Past Surgical History:  Procedure Laterality Date   COLONOSCOPY  2001   ESOPHAGOGASTRODUODENOSCOPY  2001   NM MYOVIEW LTD  December 2015   EF 50%, No ischemia or infarction   SMALL BOWEL ENTEROSCOPY  2001     Current Outpatient Medications  Medication Sig Dispense Refill   amLODipine (NORVASC) 10 MG tablet Take 1 tablet (10 mg total) by mouth daily. 30 tablet 03   aspirin 81 MG tablet Take 81 mg by mouth daily.     carvedilol (COREG) 6.25 MG tablet Take 1 tablet (6.25 mg total) by mouth 2 (two) times daily. 60 tablet 6   hydrALAZINE (APRESOLINE) 25 MG tablet Take 1 tablet (25 mg total) by mouth 2 (two) times daily. May take an extra 50 mg if blood pressure is greater than 160 as needed 60 tablet 1   omeprazole (PRILOSEC) 20 MG capsule TAKE 1 CAPSULE BY MOUTH TWICE A DAY 60 capsule 0   potassium chloride  SA (K-DUR,KLOR-CON) 20 MEQ tablet Take 2 tablets (40 mEq total) by mouth 3 (three) times daily. 540 tablet 3   rosuvastatin (CRESTOR) 40 MG tablet Take 1 tablet (40 mg total) by mouth at bedtime. 30 tablet 3   spironolactone (ALDACTONE) 25 MG tablet TAKE 1 TABLET BY MOUTH EVERY DAY 30 tablet 3   varenicline (CHANTIX PAK) 0.5 MG X 11 & 1 MG X 42 tablet Take 0.5-2 mg by mouth See admin instructions. Take one 0.5 mg tablet by mouth once daily for 3 days, then increase to one 0.5 mg tablet twice daily for 4 days, then increase to one 1 mg tablet twice daily.     No current facility-administered medications for this visit.    Allergies:   Patient has no known allergies.    Social History:  The patient  reports that he has  been smoking cigarettes. He has a 20.00 pack-year smoking history. He has never used smokeless tobacco. He reports current alcohol use. He reports that he does not use drugs.   Family History:  The patient's family history includes Cancer in his mother; Diabetes in his mother; Heart attack in his father; Heart failure in his mother; Hyperlipidemia in his father; Hypertension in his father and sister; Sleep apnea in his sister.    ROS: All other systems are reviewed and negative. Unless otherwise mentioned in H&P    PHYSICAL EXAM: VS:  There were no vitals taken for this visit. , BMI There is no height or weight on file to calculate BMI. GEN: Well nourished, well developed, in no acute distress HEENT: normal Neck: no JVD, carotid bruits, or masses Cardiac: ***RRR; no murmurs, rubs, or gallops,no edema  Respiratory:  Clear to auscultation bilaterally, normal work of breathing GI: soft, nontender, nondistended, + BS MS: no deformity or atrophy Skin: warm and dry, no rash Neuro:  Strength and sensation are intact Psych: euthymic mood, full affect   EKG:  EKG {ACTION; IS/IS [id number]} ordered today. The ekg ordered today demonstrates ***   Recent Labs: 12/25/2020: ALT 17; BUN 19; Creatinine, Ser 1.07; Hemoglobin 18.4; Platelets 222; Potassium 3.1; Sodium 142; TSH 0.411    Lipid Panel    Component Value Date/Time   CHOL 205 (H) 12/25/2020 1645   TRIG 87 12/25/2020 1645   HDL 48 12/25/2020 1645   CHOLHDL 4.3 12/25/2020 1645   CHOLHDL 5.3 12/02/2017 0531   VLDL 21 12/02/2017 0531   LDLCALC 141 (H) 12/25/2020 1645   LDLDIRECT 112.0 02/04/2014 0955      Wt Readings from Last 3 Encounters:  12/25/20 242 lb 3.2 oz (109.9 kg)  03/08/19 269 lb 12.8 oz (122.4 kg)  12/25/17 265 lb (120.2 kg)      Other studies Reviewed: Additional studies/ records that were reviewed today include: ***. Review of the above records demonstrates: ***   ASSESSMENT AND PLAN:  1.   ***   Current medicines are reviewed at length with the patient today.  I have spent *** dedicated to the care of this patient on the date of this encounter to include pre-visit review of records, assessment, management and diagnostic testing,with shared decision making.  Labs/ tests ordered today include: *** Bettey Mare. Liborio Nixon, ANP, AACC   01/28/2021 5:02 PM    [facility] [address] Office [phone] Fax [phone]  Notice: This dictation was prepared with Dragon dictation along with smaller phrase technology. Any transcriptional errors that result from this process are unintentional  and may not be corrected upon review.

## 2021-01-29 ENCOUNTER — Ambulatory Visit: Payer: 59 | Admitting: Adult Health

## 2021-04-16 ENCOUNTER — Other Ambulatory Visit: Payer: Self-pay | Admitting: Physician Assistant

## 2021-04-16 DIAGNOSIS — N5089 Other specified disorders of the male genital organs: Secondary | ICD-10-CM

## 2021-05-20 ENCOUNTER — Other Ambulatory Visit: Payer: Self-pay | Admitting: Adult Health

## 2021-06-29 ENCOUNTER — Other Ambulatory Visit: Payer: Self-pay | Admitting: Adult Health

## 2021-07-09 ENCOUNTER — Other Ambulatory Visit: Payer: Self-pay | Admitting: Adult Health

## 2021-07-24 ENCOUNTER — Other Ambulatory Visit: Payer: Self-pay | Admitting: Adult Health

## 2022-12-05 ENCOUNTER — Other Ambulatory Visit: Payer: Self-pay | Admitting: Family Medicine

## 2022-12-05 DIAGNOSIS — F17200 Nicotine dependence, unspecified, uncomplicated: Secondary | ICD-10-CM

## 2022-12-16 ENCOUNTER — Ambulatory Visit
Admission: RE | Admit: 2022-12-16 | Discharge: 2022-12-16 | Disposition: A | Discharge status: Home / Self Care | Payer: 59 | Source: Ambulatory Visit | Attending: Family Medicine | Admitting: Family Medicine

## 2022-12-16 DIAGNOSIS — F17200 Nicotine dependence, unspecified, uncomplicated: Secondary | ICD-10-CM

## 2023-01-06 ENCOUNTER — Other Ambulatory Visit: Payer: Self-pay | Admitting: Family Medicine

## 2023-01-06 DIAGNOSIS — F17200 Nicotine dependence, unspecified, uncomplicated: Secondary | ICD-10-CM

## 2023-03-08 ENCOUNTER — Other Ambulatory Visit: Payer: Self-pay | Admitting: Orthopedic Surgery

## 2023-04-04 ENCOUNTER — Encounter (HOSPITAL_BASED_OUTPATIENT_CLINIC_OR_DEPARTMENT_OTHER): Payer: Self-pay

## 2023-04-04 ENCOUNTER — Ambulatory Visit (HOSPITAL_BASED_OUTPATIENT_CLINIC_OR_DEPARTMENT_OTHER): Admit: 2023-04-04 | Payer: 59 | Admitting: Orthopedic Surgery

## 2023-04-04 SURGERY — EXCISION MASS
Anesthesia: Choice | Site: Hand | Laterality: Left

## 2023-06-12 ENCOUNTER — Other Ambulatory Visit: Payer: Self-pay | Admitting: Family Medicine

## 2023-06-12 DIAGNOSIS — I723 Aneurysm of iliac artery: Secondary | ICD-10-CM

## 2023-06-14 ENCOUNTER — Ambulatory Visit
Admission: RE | Admit: 2023-06-14 | Discharge: 2023-06-14 | Disposition: A | Discharge status: Home / Self Care | Source: Ambulatory Visit | Attending: Family Medicine | Admitting: Family Medicine

## 2023-06-14 DIAGNOSIS — I723 Aneurysm of iliac artery: Secondary | ICD-10-CM

## 2023-06-14 MED ORDER — IOPAMIDOL (ISOVUE-300) INJECTION 61%
100.0000 mL | Freq: Once | INTRAVENOUS | Status: AC | PRN
Start: 2023-06-14 — End: 2023-06-14
  Administered 2023-06-14: 100 mL via INTRAVENOUS

## 2024-02-20 ENCOUNTER — Other Ambulatory Visit: Payer: Self-pay

## 2024-02-20 ENCOUNTER — Inpatient Hospital Stay (HOSPITAL_COMMUNITY)
Admission: EM | Admit: 2024-02-20 | Discharge: 2024-02-22 | DRG: 291 | Disposition: A | Discharge status: Home / Self Care | Attending: Internal Medicine | Admitting: Internal Medicine

## 2024-02-20 ENCOUNTER — Encounter (HOSPITAL_COMMUNITY): Payer: Self-pay | Admitting: Student

## 2024-02-20 ENCOUNTER — Emergency Department (HOSPITAL_COMMUNITY)

## 2024-02-20 DIAGNOSIS — Z79899 Other long term (current) drug therapy: Secondary | ICD-10-CM

## 2024-02-20 DIAGNOSIS — F1020 Alcohol dependence, uncomplicated: Secondary | ICD-10-CM | POA: Diagnosis present

## 2024-02-20 DIAGNOSIS — R0683 Snoring: Secondary | ICD-10-CM | POA: Diagnosis present

## 2024-02-20 DIAGNOSIS — I2489 Other forms of acute ischemic heart disease: Secondary | ICD-10-CM | POA: Diagnosis present

## 2024-02-20 DIAGNOSIS — I110 Hypertensive heart disease with heart failure: Principal | ICD-10-CM | POA: Diagnosis present

## 2024-02-20 DIAGNOSIS — E66812 Obesity, class 2: Secondary | ICD-10-CM | POA: Diagnosis present

## 2024-02-20 DIAGNOSIS — I2510 Atherosclerotic heart disease of native coronary artery without angina pectoris: Secondary | ICD-10-CM | POA: Diagnosis present

## 2024-02-20 DIAGNOSIS — E876 Hypokalemia: Secondary | ICD-10-CM | POA: Diagnosis present

## 2024-02-20 DIAGNOSIS — I5033 Acute on chronic diastolic (congestive) heart failure: Secondary | ICD-10-CM | POA: Diagnosis present

## 2024-02-20 DIAGNOSIS — R0602 Shortness of breath: Secondary | ICD-10-CM | POA: Diagnosis present

## 2024-02-20 DIAGNOSIS — E269 Hyperaldosteronism, unspecified: Secondary | ICD-10-CM | POA: Diagnosis present

## 2024-02-20 DIAGNOSIS — Z83438 Family history of other disorder of lipoprotein metabolism and other lipidemia: Secondary | ICD-10-CM | POA: Diagnosis not present

## 2024-02-20 DIAGNOSIS — Z6836 Body mass index (BMI) 36.0-36.9, adult: Secondary | ICD-10-CM

## 2024-02-20 DIAGNOSIS — I4891 Unspecified atrial fibrillation: Secondary | ICD-10-CM | POA: Diagnosis present

## 2024-02-20 DIAGNOSIS — I452 Bifascicular block: Secondary | ICD-10-CM | POA: Diagnosis present

## 2024-02-20 DIAGNOSIS — Z716 Tobacco abuse counseling: Secondary | ICD-10-CM

## 2024-02-20 DIAGNOSIS — F17210 Nicotine dependence, cigarettes, uncomplicated: Secondary | ICD-10-CM | POA: Diagnosis present

## 2024-02-20 DIAGNOSIS — R7303 Prediabetes: Secondary | ICD-10-CM | POA: Diagnosis present

## 2024-02-20 DIAGNOSIS — E6609 Other obesity due to excess calories: Secondary | ICD-10-CM

## 2024-02-20 DIAGNOSIS — Z8249 Family history of ischemic heart disease and other diseases of the circulatory system: Secondary | ICD-10-CM | POA: Diagnosis not present

## 2024-02-20 DIAGNOSIS — E7800 Pure hypercholesterolemia, unspecified: Secondary | ICD-10-CM | POA: Diagnosis present

## 2024-02-20 DIAGNOSIS — K219 Gastro-esophageal reflux disease without esophagitis: Secondary | ICD-10-CM | POA: Diagnosis present

## 2024-02-20 DIAGNOSIS — Z7982 Long term (current) use of aspirin: Secondary | ICD-10-CM | POA: Diagnosis not present

## 2024-02-20 DIAGNOSIS — I1A0 Resistant hypertension: Secondary | ICD-10-CM | POA: Diagnosis present

## 2024-02-20 DIAGNOSIS — Z7901 Long term (current) use of anticoagulants: Secondary | ICD-10-CM | POA: Diagnosis not present

## 2024-02-20 DIAGNOSIS — I4819 Other persistent atrial fibrillation: Secondary | ICD-10-CM | POA: Diagnosis not present

## 2024-02-20 DIAGNOSIS — I509 Heart failure, unspecified: Principal | ICD-10-CM

## 2024-02-20 DIAGNOSIS — Z833 Family history of diabetes mellitus: Secondary | ICD-10-CM | POA: Diagnosis not present

## 2024-02-20 LAB — CBC
HCT: 44.9 % (ref 39.0–52.0)
Hemoglobin: 14.8 g/dL (ref 13.0–17.0)
MCH: 30.1 pg (ref 26.0–34.0)
MCHC: 33 g/dL (ref 30.0–36.0)
MCV: 91.3 fL (ref 80.0–100.0)
Platelets: 173 K/uL (ref 150–400)
RBC: 4.92 MIL/uL (ref 4.22–5.81)
RDW: 14.2 % (ref 11.5–15.5)
WBC: 6.4 K/uL (ref 4.0–10.5)
nRBC: 0 % (ref 0.0–0.2)

## 2024-02-20 LAB — MAGNESIUM: Magnesium: 2.2 mg/dL (ref 1.7–2.4)

## 2024-02-20 LAB — T4, FREE: Free T4: 1.21 ng/dL (ref 0.80–2.00)

## 2024-02-20 LAB — BASIC METABOLIC PANEL WITH GFR
Anion gap: 10 (ref 5–15)
BUN: 19 mg/dL (ref 8–23)
CO2: 25 mmol/L (ref 22–32)
Calcium: 9.1 mg/dL (ref 8.9–10.3)
Chloride: 105 mmol/L (ref 98–111)
Creatinine, Ser: 0.91 mg/dL (ref 0.61–1.24)
GFR, Estimated: >60 mL/min
Glucose, Bld: 109 mg/dL — ABNORMAL HIGH (ref 70–99)
Potassium: 3.4 mmol/L — ABNORMAL LOW (ref 3.5–5.1)
Sodium: 141 mmol/L (ref 135–145)

## 2024-02-20 LAB — TSH: TSH: 0.316 u[IU]/mL — ABNORMAL LOW (ref 0.350–4.500)

## 2024-02-20 LAB — CBG MONITORING, ED: Glucose-Capillary: 88 mg/dL (ref 70–99)

## 2024-02-20 LAB — TROPONIN T, HIGH SENSITIVITY
Troponin T High Sensitivity: 36 ng/L — ABNORMAL HIGH (ref 0–19)
Troponin T High Sensitivity: 36 ng/L — ABNORMAL HIGH (ref 0–19)

## 2024-02-20 LAB — PRO BRAIN NATRIURETIC PEPTIDE: Pro Brain Natriuretic Peptide: 752 pg/mL — ABNORMAL HIGH

## 2024-02-20 MED ORDER — ENOXAPARIN SODIUM 40 MG/0.4ML IJ SOSY
40.0000 mg | PREFILLED_SYRINGE | Freq: Every day | INTRAMUSCULAR | Status: DC
  Administered 2024-02-20: 40 mg via SUBCUTANEOUS
  Filled 2024-02-20: qty 0.4

## 2024-02-20 MED ORDER — ORAL CARE MOUTH RINSE: 15.0000 mL | OROMUCOSAL | Status: DC | PRN

## 2024-02-20 MED ORDER — ASPIRIN 81 MG PO CHEW
81.0000 mg | CHEWABLE_TABLET | Freq: Every day | ORAL | Status: DC
  Administered 2024-02-21: 81 mg via ORAL
  Filled 2024-02-20: qty 1

## 2024-02-20 MED ORDER — ROSUVASTATIN CALCIUM 20 MG PO TABS
40.0000 mg | ORAL_TABLET | Freq: Every day | ORAL | Status: DC
  Administered 2024-02-20 – 2024-02-21 (×2): 40 mg via ORAL
  Filled 2024-02-20 (×2): qty 2

## 2024-02-20 MED ORDER — LISINOPRIL 20 MG PO TABS: 20.0000 mg | ORAL_TABLET | Freq: Every day | ORAL | Status: DC

## 2024-02-20 MED ORDER — ACETAMINOPHEN 650 MG RE SUPP: 650.0000 mg | Freq: Four times a day (QID) | RECTAL | Status: DC | PRN

## 2024-02-20 MED ORDER — SPIRONOLACTONE 25 MG PO TABS
25.0000 mg | ORAL_TABLET | Freq: Every day | ORAL | Status: DC
  Administered 2024-02-21 – 2024-02-22 (×2): 25 mg via ORAL
  Filled 2024-02-20 (×2): qty 1

## 2024-02-20 MED ORDER — BISACODYL 5 MG PO TBEC: 5.0000 mg | DELAYED_RELEASE_TABLET | Freq: Every day | ORAL | Status: DC | PRN

## 2024-02-20 MED ORDER — ONDANSETRON HCL 4 MG PO TABS: 4.0000 mg | ORAL_TABLET | Freq: Four times a day (QID) | ORAL | Status: DC | PRN

## 2024-02-20 MED ORDER — SENNOSIDES-DOCUSATE SODIUM 8.6-50 MG PO TABS: 1.0000 | ORAL_TABLET | Freq: Every evening | ORAL | Status: DC | PRN

## 2024-02-20 MED ORDER — FUROSEMIDE 10 MG/ML IJ SOLN
40.0000 mg | Freq: Every day | INTRAMUSCULAR | Status: DC
  Administered 2024-02-21: 40 mg via INTRAVENOUS
  Filled 2024-02-20 (×2): qty 4

## 2024-02-20 MED ORDER — ONDANSETRON HCL 4 MG/2ML IJ SOLN: 4.0000 mg | Freq: Four times a day (QID) | INTRAMUSCULAR | Status: DC | PRN

## 2024-02-20 MED ORDER — POTASSIUM CHLORIDE CRYS ER 20 MEQ PO TBCR
40.0000 meq | EXTENDED_RELEASE_TABLET | Freq: Once | ORAL | Status: AC
  Administered 2024-02-20: 40 meq via ORAL
  Filled 2024-02-20: qty 2

## 2024-02-20 MED ORDER — FUROSEMIDE 10 MG/ML IJ SOLN
40.0000 mg | Freq: Once | INTRAMUSCULAR | Status: AC
  Administered 2024-02-20: 40 mg via INTRAVENOUS
  Filled 2024-02-20: qty 4

## 2024-02-20 MED ORDER — ACETAMINOPHEN 325 MG PO TABS: 650.0000 mg | ORAL_TABLET | Freq: Four times a day (QID) | ORAL | Status: DC | PRN

## 2024-02-20 MED ORDER — METOPROLOL SUCCINATE ER 25 MG PO TB24
25.0000 mg | ORAL_TABLET | Freq: Every day | ORAL | Status: DC
  Administered 2024-02-20 – 2024-02-21 (×2): 25 mg via ORAL
  Filled 2024-02-20 (×2): qty 1

## 2024-02-20 NOTE — ED Provider Notes (Signed)
 " [place] [facility] AT [facility] Provider Note   CSN: [id number] Arrival date & time: 02/20/24  1015     Patient presents with: Shortness of Breath   Larry [name] is a 69 y.o. male.    Shortness of Breath  Patient is a 69 year old male with a past medical history significant for HTN, CAD--seen on CT imaging in 2019--also follow-up with cardiology although he tells me that he has a history of A-fib was started on aspirin  and takes carvedilol .  Seems that he continues to take these medications.  Patient states that for the past 6 weeks he has been having progressively worsening dyspnea.  He states it is worse with exertion but also he is experiencing some orthopnea at night.  He states he is normally able to lay on 1 side or prop himself up.  No recent surgeries, hospitalization, long travel, hemoptysis, estrogen containing OCP, cancer history.  No unilateral leg swelling.  No history of PE or VTE.     Prior to Admission medications  Medication Sig Start Date End Date Taking? Authorizing Provider  amLODipine  (NORVASC ) 10 MG tablet Take 1 tablet (10 mg total) by mouth daily. 12/25/20   Jerilynn Lamarr HERO, NP  aspirin  81 MG tablet Take 81 mg by mouth daily.    [provider]  carvedilol  (COREG ) 6.25 MG tablet TAKE 1 TABLET BY MOUTH 2 (TWO) TIMES DAILY. SCHEDULE AN APPOINTMENT FOR FURTHER REFILLS 07/26/21   Jerilynn Lamarr HERO, NP  hydrALAZINE  (APRESOLINE ) 25 MG tablet Take 1 tablet (25 mg total) by mouth 2 (two) times daily. May take an extra 50 mg if blood pressure is greater than 160 as needed 12/25/20 03/25/21  Jerilynn Lamarr HERO, NP  omeprazole  (PRILOSEC) 20 MG capsule TAKE 1 CAPSULE BY MOUTH TWICE A DAY 04/01/19   Anner Alm ORN, MD  potassium chloride  SA (K-DUR,KLOR-CON ) 20 MEQ tablet Take 2 tablets (40 mEq total) by mouth 3 (three) times daily. 06/15/16   Anner Alm ORN, MD  rosuvastatin  (CRESTOR ) 40 MG tablet Take 1 tablet (40 mg total) by  mouth at bedtime. 12/25/20 03/25/21  Jerilynn Lamarr HERO, NP  spironolactone  (ALDACTONE ) 25 MG tablet TAKE 1 TABLET BY MOUTH EVERY DAY 05/20/21   Anner Alm ORN, MD  varenicline  (CHANTIX  PAK) 0.5 MG X 11 & 1 MG X 42 tablet Take 0.5-2 mg by mouth See admin instructions. Take one 0.5 mg tablet by mouth once daily for 3 days, then increase to one 0.5 mg tablet twice daily for 4 days, then increase to one 1 mg tablet twice daily.    [provider]    Allergies: Patient has no known allergies.    Review of Systems  Respiratory:  Positive for shortness of breath.     Updated Vital Signs BP (!) 133/103 (BP Location: Left Arm)   Pulse 75   Temp 98.1 F (36.7 C) (Oral)   Resp 19   SpO2 99%   Physical Exam Vitals and nursing note reviewed.  Constitutional:      General: He is not in acute distress. HENT:     Head: Normocephalic and atraumatic.     Nose: Nose normal.     Mouth/Throat:     Mouth: Mucous membranes are moist.  Eyes:     General: No scleral icterus. Neck:     Comments: JVD present with hepatojugular reflux maneuver Cardiovascular:     Rate and Rhythm: Normal rate. Rhythm irregular.     Pulses: Normal  pulses.     Heart sounds: Normal heart sounds.  Pulmonary:     Effort: Pulmonary effort is normal. No respiratory distress.     Breath sounds: No wheezing.     Comments: Slightly breathless but speaking in full sentences, mildly tachypneic with minor exertion such as moving in the bed to look at the monitor. Abdominal:     Palpations: Abdomen is soft.     Tenderness: There is no abdominal tenderness. There is no guarding or rebound.  Musculoskeletal:     Cervical back: Normal range of motion.     Right lower leg: Edema present.     Left lower leg: Edema present.     Comments: Trace lower extremity pitting edema to the ankles  Skin:    General: Skin is warm and dry.     Capillary Refill: Capillary refill takes less than 2 seconds.  Neurological:     Mental  Status: He is alert. Mental status is at baseline.  Psychiatric:        Mood and Affect: Mood normal.        Behavior: Behavior normal.     (all labs ordered are listed, but only abnormal results are displayed) Labs Reviewed  BASIC METABOLIC PANEL WITH GFR - Abnormal; Notable for the following components:      Result Value   Potassium 3.4 (*)    Glucose, Bld 109 (*)    All other components within normal limits  CBC  TSH  MAGNESIUM  PRO BRAIN NATRIURETIC PEPTIDE  CBG MONITORING, ED  TROPONIN T, HIGH SENSITIVITY    EKG: None  Radiology: DG Chest 2 View Result Date: 02/20/2024 CLINICAL DATA:  Shortness of breath past few months. EXAM: CHEST - 2 VIEW COMPARISON:  12/01/2017 FINDINGS: Lungs are adequately inflated and otherwise clear. Cardiomediastinal silhouette and remainder of the exam is unchanged. IMPRESSION: No active cardiopulmonary disease. Electronically Signed   By: Toribio Agreste M.D.   On: 02/20/2024 11:20     .Critical Care  Performed by: Neldon Hamp RAMAN, PA Authorized by: Neldon Hamp RAMAN, PA   Critical care provider statement:    Critical care time (minutes):  35   Critical care time was exclusive of:  Separately billable procedures and treating other patients and teaching time   Critical care was necessary to treat or prevent imminent or life-threatening deterioration of the following conditions: New onset heart failure, A-fib, elevated troponin.   Critical care was time spent personally by me on the following activities:  Development of treatment plan with patient or surrogate, review of old charts, re-evaluation of patient's condition, pulse oximetry, ordering and review of radiographic studies, ordering and review of laboratory studies, ordering and performing treatments and interventions, obtaining history from patient or surrogate, examination of patient and evaluation of patient's response to treatment   Care discussed with: admitting provider       Medications Ordered in the ED  potassium chloride  SA (KLOR-CON  M) CR tablet 40 mEq (40 mEq Oral Given 02/20/24 1603)    Clinical Course as of 02/20/24 1752  Tue Feb 20, 2024  1548 6 weeks of SOB Worse with laying down but also exertional.    [WF]  1549 CAD in 2019 off CT (nonobstructive)  [WF]  1744 Dr. Regino cards will see tomorrow [WF]    Clinical Course User Index [WF] Neldon Hamp RAMAN, PA  Medical Decision Making Amount and/or Complexity of Data Reviewed Labs: ordered. Radiology: ordered.  Risk Prescription drug management. Decision regarding hospitalization.   This patient presents to the ED for concern of SOB, this involves a number of treatment options, and is a complaint that carries with it a moderate risk of complications and morbidity. A differential diagnosis was considered for the patient's symptoms which is discussed below:   The causes for shortness of breath include but are not limited to Cardiac (AHF, pericardial effusion and tamponade, arrhythmias, ischemia, etc) Respiratory (COPD, asthma, pneumonia, pneumothorax, primary pulmonary hypertension, PE/VQ mismatch) Hematological (anemia) Neuromuscular (ALS, Guillain-Barr, etc)    Co morbidities: Discussed in HPI   Brief History:  Patient is a 69 year old male with a past medical history significant for HTN, CAD--seen on CT imaging in 2019--also follow-up with cardiology although he tells me that he has a history of A-fib was started on aspirin  and takes carvedilol .  Seems that he continues to take these medications.  Patient states that for the past 6 weeks he has been having progressively worsening dyspnea.  He states it is worse with exertion but also he is experiencing some orthopnea at night.  He states he is normally able to lay on 1 side or prop himself up.  No recent surgeries, hospitalization, long travel, hemoptysis, estrogen containing OCP, cancer history.  No  unilateral leg swelling.  No history of PE or VTE.    EMR reviewed including pt PMHx, past surgical history and past visits to ER.   See HPI for more details   Lab Tests:  I personally reviewed all laboratory work and imaging. Metabolic panel without any acute abnormality specifically kidney function within normal limits and no significant electrolyte abnormalities. CBC without leukocytosis or significant anemia. Potassium mildly low at 3.4 given repletion.  proBNP elevated at 750 magnesium normal.  First troponin mildly elevated 36 likely due to fluid overload.  Imaging Studies:  NAD. I personally reviewed all imaging studies and no acute abnormality found. I agree with radiology interpretation.    Cardiac Monitoring:  The patient was maintained on a cardiac monitor.  I personally viewed and interpreted the cardiac monitored which showed an underlying rhythm of: afib EKG non-ischemic afib   Medicines ordered:  I ordered medication including lasix , metoprolol  and K+  Reevaluation of the patient after these medicines showed that the patient improved I have reviewed the patients home medicines and have made adjustments as needed   Critical Interventions:     Consults/Attending Physician   I requested consultation with Dr. Dellia,  and discussed lab and imaging findings as well as pertinent plan - they recommend: will see tomorrow   Reevaluation:  After the interventions noted above I re-evaluated patient and found that they have :improved   Social Determinants of Health:      Problem List / ED Course:  CHF/ACS causing dyspnea.  Atrial fibrillation Patient has not had a coronary evaluation in a long time.  Discussed with cardiology who will make recommendations.  Admitted to hospitalist.   Dispostion:  After consideration of the diagnostic results and the patients response to treatment, I feel that the patent would benefit from admission.  Final  diagnoses:  Acute congestive heart failure, unspecified heart failure type (HCC)  SOB (shortness of breath)  Hypokalemia  Atrial fibrillation, unspecified type [facility])    ED Discharge Orders     None          Neldon Hamp RAMAN, [place] 02/20/24 1840  "

## 2024-02-20 NOTE — ED Triage Notes (Signed)
 C/o SOB for the last few months. Worsens with lying down. Chest is tender to the touch per patient.

## 2024-02-20 NOTE — H&P (Signed)
 " History and Physical  Larry [name] FMW:[id number] DOB: December 12, 1955 DOA: 02/20/2024  PCP: Sun, Vyvyan, MD   Chief Complaint: Shortness of breath  HPI: Larry [name] is a 69 y.o. male with medical history significant for chronic diastolic HF, hyperaldosteronism, HTN, HLD, nonobstructive CAD, GERD, hypokalemia and obesity who presented to the ED for evaluation of shortness of breath. Patient reports she has had shortness of breath that has been progressive over the last 4 months that has been worse over the last few weeks. He also endorsed orthopnea, worsening dyspnea on exertion and fatigue. He denies any chest pain, fever, chills, nausea, vomiting, abdominal pain or dysuria.  ED Course: Initial vitals show patient afebrile, HR 60-80s, SBP 130-160s, SpO2 99% on room air. Initial labs significant for K+ 3.4, proBNP 752, troponin 36, TSH 0.316, otherwise normal renal function and CBC. EKG shows A-fib with RBBB and LAFB. CXR shows no active disease.  Pt received IV Lasix  40 mg x 1 and KCl 40 mEq x 1.  Cardiology (Dr. Loni) was consulted for evaluation. [facility] was consulted for admission.   Review of Systems: Please see HPI for pertinent positives and negatives. A complete 10 system review of systems are otherwise negative.  Past Medical History:  Diagnosis Date   AR (allergic rhinitis)    EtOH dependence (HCC) 12/25/2020   Whiskey 6 oz each day and more on the weekends.    Hyperaldosteronism    Hyperglycemia 01/2014   A1c 6.0   Hyperlipidemia    Hypertension    Difficult to control - ? Secondary to Hyperaldosteronism.   Hypokalemia    Rectal bleed 2001   Thyroid  nodule 01/2014   Past Surgical History:  Procedure Laterality Date   COLONOSCOPY  2001   ESOPHAGOGASTRODUODENOSCOPY  2001   NM MYOVIEW  LTD  December 2015   EF 50%, No ischemia or infarction   SMALL BOWEL ENTEROSCOPY  2001   Social History:  reports that he has been smoking cigarettes. He has a 20 pack-year smoking history.  He has never used smokeless tobacco. He reports current alcohol use. He reports that he does not use drugs.  Allergies[1]  Family History  Problem Relation Age of Onset   Cancer Mother    Heart failure Mother    Diabetes Mother    Hypertension Father    Hyperlipidemia Father    Heart attack Father    Sleep apnea Sister    Hypertension Sister    Thyroid  disease Neg Hx      Prior to Admission medications  Medication Sig Start Date End Date Taking? Authorizing Provider  amLODipine  (NORVASC ) 10 MG tablet Take 1 tablet (10 mg total) by mouth daily. 12/25/20  Yes Jerilynn Lamarr HERO, NP  aspirin  81 MG tablet Take 81 mg by mouth daily.   Yes [provider]  carvedilol  (COREG ) 6.25 MG tablet TAKE 1 TABLET BY MOUTH 2 (TWO) TIMES DAILY. SCHEDULE AN APPOINTMENT FOR FURTHER REFILLS Patient taking differently: Take 6.25 mg by mouth 2 (two) times daily with a meal. 07/26/21  Yes Jerilynn Lamarr HERO, NP  lisinopril  (ZESTRIL ) 10 MG tablet Take 20 mg by mouth daily.   Yes [provider]  rosuvastatin  (CRESTOR ) 40 MG tablet Take 40 mg by mouth at bedtime.   Yes [provider]  spironolactone  (ALDACTONE ) 25 MG tablet TAKE 1 TABLET BY MOUTH EVERY DAY Patient taking differently: Take 25 mg by mouth daily. 05/20/21  Yes Anner Alm ORN, MD  hydrALAZINE  (APRESOLINE ) 25 MG tablet  Take 1 tablet (25 mg total) by mouth 2 (two) times daily. May take an extra 50 mg if blood pressure is greater than 160 as needed Patient not taking: Reported on 02/20/2024 12/25/20 02/20/24  Jerilynn Lamarr HERO, NP  omeprazole  (PRILOSEC) 20 MG capsule TAKE 1 CAPSULE BY MOUTH TWICE A DAY Patient not taking: Reported on 02/20/2024 04/01/19   Anner Alm ORN, MD  potassium chloride  SA (K-DUR,KLOR-CON ) 20 MEQ tablet Take 2 tablets (40 mEq total) by mouth 3 (three) times daily. 06/15/16   Anner Alm ORN, MD  rosuvastatin  (CRESTOR ) 40 MG tablet Take 1 tablet (40 mg total) by mouth at bedtime. Patient not taking:  Reported on 02/20/2024 12/25/20 02/20/24  Jerilynn Lamarr HERO, NP    Physical Exam: BP (!) 153/113   Pulse 80   Temp 98.1 F (36.7 C) (Oral)   Resp 19   SpO2 99%  General: Pleasant, well-appearing obese man laying in bed. No acute distress. HEENT: [place]/AT. Anicteric sclera Neck: Thick neck. JVD to the mid neck. CV: Regular rate. Irregularly irregular rhythm. No murmurs, rubs, or gallops. Pulmonary: Lungs CTAB. Normal effort. No wheezing or rales. Decreased breath sounds at the bases. Abdominal: Abdominal obesity. Soft, nontender, nondistended. Normal bowel sounds. Extremities: Trace BLE edema. Palpable radial and DP pulses. Normal ROM. Skin: Warm and dry. No obvious rash or lesions. Neuro: A&Ox3. Moves all extremities. Normal sensation to light touch. No focal deficit. Psych: Normal mood and affect          Labs on Admission:  Basic Metabolic Panel: Recent Labs  Lab 02/20/24 1128 02/20/24 1610  NA 141  --   K 3.4*  --   CL 105  --   CO2 25  --   GLUCOSE 109*  --   BUN 19  --   CREATININE 0.91  --   CALCIUM  9.1  --   MG  --  2.2   Liver Function Tests: No results for input(s): AST, ALT, ALKPHOS, BILITOT, PROT, ALBUMIN in the last 168 hours. No results for input(s): LIPASE, AMYLASE in the last 168 hours. No results for input(s): AMMONIA in the last 168 hours. CBC: Recent Labs  Lab 02/20/24 1128  WBC 6.4  HGB 14.8  HCT 44.9  MCV 91.3  PLT 173   Cardiac Enzymes: No results for input(s): CKTOTAL, CKMB, CKMBINDEX, TROPONINI in the last 168 hours. BNP (last 3 results) No results for input(s): BNP in the last 8760 hours.  ProBNP (last 3 results) Recent Labs    02/20/24 1610  PROBNP 752.0*    CBG: Recent Labs  Lab 02/20/24 1419  GLUCAP 88    Radiological Exams on Admission: DG Chest 2 View Result Date: 02/20/2024 CLINICAL DATA:  Shortness of breath past few months. EXAM: CHEST - 2 VIEW COMPARISON:  12/01/2017 FINDINGS: Lungs are  adequately inflated and otherwise clear. Cardiomediastinal silhouette and remainder of the exam is unchanged. IMPRESSION: No active cardiopulmonary disease. Electronically Signed   By: Toribio Agreste M.D.   On: 02/20/2024 11:20   Assessment/Plan Larry [name] is a 69 y.o. male with medical history significant for chronic diastolic HF, hyperaldosteronism, HTN, HLD, nonobstructive CAD, GERD, hypokalemia and obesity who presented to the ED for evaluation of shortness of breath admitted for acute on chronic diastolic heart failure.  # Acute on chronic diastolic HF - Last TTE on 01/2014 showed EF 55-60%, G1DD and moderate to severely dilated LA - Pt presented with progressive shortness of breath and dyspnea on exertion with associated  orthopnea - Pt with clinical signs of CHF exacerbation, proBNP elevated to 752 but normal for age range - Start IV lasix  40 mg daily, patient is Lasix  nave, uptitrate as needed - Cardiology consulted, appreciate further recs - Patient with no recent refills of his BP meds, resume lisinopril  and spironolactone  and continue Toprol  XL (started by EDP) - Follow up repeat echocardiogram - Strict I&O, daily weights - Maintain K+ > 4.0, Mag > 2.0  # Atrial fibrillation - Remains in A-fib but currently rate controlled - Patient not on anticoagulation, but reports he is on aspirin  - Cardiology following, appreciate further recs - Continue Toprol  XL for now, will likely need to initiate anticoagulation during the hospitalization - Telemetry  # Resistant hypertension # Hyperaldosteronism - BP elevated with SBP in the 130-160s. History of resistant hypertension in the setting of hyperaldosteronism - Per med rec, patient has not had any recent refills of his BP meds - Resume lisinopril  and spironolactone , continue Toprol  XL  # Hypokalemia - K+ slightly low at 3.4, repleted with KCl 40 mEq x 1 in the ED - Follow-up morning K+ and mag  # HLD - Continue rosuvastatin   #  CAD - History of mild nonobstructive CAD based on coronary CTA in 2019 - Continue aspirin  and rosuvastatin   # Obesity - Likely complicating acute presentation - Check weight to calculate BMI  DVT prophylaxis: Lovenox      Code Status: Full Code  Consults called: Cardiology  Family Communication: No family at bedside  Severity of Illness: The appropriate patient status for this patient is INPATIENT. Inpatient status is judged to be reasonable and necessary in order to provide the required intensity of service to ensure the patient's safety. The patient's presenting symptoms, physical exam findings, and initial radiographic and laboratory data in the context of their chronic comorbidities is felt to place them at high risk for further clinical deterioration. Furthermore, it is not anticipated that the patient will be medically stable for discharge from the hospital within 2 midnights of admission.   * I certify that at the point of admission it is my clinical judgment that the patient will require inpatient hospital care spanning beyond 2 midnights from the point of admission due to high intensity of service, high risk for further deterioration and high frequency of surveillance required.*  Level of care: Telemetry    Lou Claretta HERO, MD 02/20/2024, 6:55 PM [facility] Pager: [phone] Isaiah 41:10   If 7PM-7AM, please contact night-coverage [email or web address] Password [email or web address]     [1] No Known Allergies  "

## 2024-02-21 ENCOUNTER — Telehealth (HOSPITAL_COMMUNITY): Payer: Self-pay

## 2024-02-21 ENCOUNTER — Inpatient Hospital Stay (HOSPITAL_COMMUNITY)

## 2024-02-21 ENCOUNTER — Other Ambulatory Visit (HOSPITAL_COMMUNITY): Payer: Self-pay

## 2024-02-21 DIAGNOSIS — I5033 Acute on chronic diastolic (congestive) heart failure: Secondary | ICD-10-CM | POA: Diagnosis not present

## 2024-02-21 DIAGNOSIS — I4819 Other persistent atrial fibrillation: Secondary | ICD-10-CM

## 2024-02-21 LAB — CBC
HCT: 43.5 % (ref 39.0–52.0)
Hemoglobin: 14.4 g/dL (ref 13.0–17.0)
MCH: 30.1 pg (ref 26.0–34.0)
MCHC: 33.1 g/dL (ref 30.0–36.0)
MCV: 90.8 fL (ref 80.0–100.0)
Platelets: 172 K/uL (ref 150–400)
RBC: 4.79 MIL/uL (ref 4.22–5.81)
RDW: 14.3 % (ref 11.5–15.5)
WBC: 6.8 K/uL (ref 4.0–10.5)
nRBC: 0 % (ref 0.0–0.2)

## 2024-02-21 LAB — ECHOCARDIOGRAM COMPLETE
AR max vel: 1.38 cm2
AV Peak grad: 21.7 mmHg
Ao pk vel: 2.33 m/s
Area-P 1/2: 3.31 cm2
Calc EF: 54.8 %
Height: 69 in
S' Lateral: 3.3 cm
Single Plane A2C EF: 39.3 %
Single Plane A4C EF: 64.3 %
Weight: 3944.01 [oz_av]

## 2024-02-21 LAB — BASIC METABOLIC PANEL WITH GFR
Anion gap: 11 (ref 5–15)
BUN: 19 mg/dL (ref 8–23)
CO2: 25 mmol/L (ref 22–32)
Calcium: 8.5 mg/dL — ABNORMAL LOW (ref 8.9–10.3)
Chloride: 104 mmol/L (ref 98–111)
Creatinine, Ser: 0.95 mg/dL (ref 0.61–1.24)
GFR, Estimated: >60 mL/min
Glucose, Bld: 108 mg/dL — ABNORMAL HIGH (ref 70–99)
Potassium: 3.2 mmol/L — ABNORMAL LOW (ref 3.5–5.1)
Sodium: 141 mmol/L (ref 135–145)

## 2024-02-21 LAB — HEMOGLOBIN A1C
Hgb A1c MFr Bld: 6 % — ABNORMAL HIGH (ref 4.8–5.6)
Mean Plasma Glucose: 125.5 mg/dL

## 2024-02-21 LAB — MAGNESIUM: Magnesium: 2.2 mg/dL (ref 1.7–2.4)

## 2024-02-21 LAB — HEPARIN LEVEL (UNFRACTIONATED): Heparin Unfractionated: 0.23 [IU]/mL — ABNORMAL LOW (ref 0.30–0.70)

## 2024-02-21 LAB — HIV ANTIBODY (ROUTINE TESTING W REFLEX): HIV Screen 4th Generation wRfx: NONREACTIVE

## 2024-02-21 MED ORDER — HEPARIN BOLUS VIA INFUSION
2000.0000 [IU] | Freq: Once | INTRAVENOUS | Status: AC
  Administered 2024-02-21: 2000 [IU] via INTRAVENOUS
  Filled 2024-02-21: qty 2000

## 2024-02-21 MED ORDER — POTASSIUM CHLORIDE CRYS ER 20 MEQ PO TBCR
40.0000 meq | EXTENDED_RELEASE_TABLET | Freq: Two times a day (BID) | ORAL | Status: AC
  Administered 2024-02-21 (×2): 40 meq via ORAL
  Filled 2024-02-21 (×2): qty 2

## 2024-02-21 MED ORDER — DAPAGLIFLOZIN PROPANEDIOL 10 MG PO TABS
10.0000 mg | ORAL_TABLET | Freq: Every day | ORAL | Status: DC
  Administered 2024-02-21 – 2024-02-22 (×2): 10 mg via ORAL
  Filled 2024-02-21 (×2): qty 1

## 2024-02-21 MED ORDER — HEPARIN (PORCINE) 25000 UT/250ML-% IV SOLN
1550.0000 [IU]/h | INTRAVENOUS | Status: DC
  Administered 2024-02-21: 1350 [IU]/h via INTRAVENOUS
  Administered 2024-02-22: 1550 [IU]/h via INTRAVENOUS
  Filled 2024-02-21 (×2): qty 250

## 2024-02-21 NOTE — Progress Notes (Signed)
 PHARMACY - ANTICOAGULATION CONSULT NOTE  Pharmacy Consult for heparin  Indication: atrial fibrillation  Allergies[1]  Patient Measurements: Height: 5' 9 (175.3 cm) Weight: 111.8 kg (246 lb 8 oz) IBW/kg (Calculated) : 70.7 HEPARIN  DW (KG): 95.4  Vital Signs: Temp: 98.6 F (37 C) (01/07 1359) Temp Source: Oral (01/07 1359) BP: 138/76 (01/07 1359) Pulse Rate: 52 (01/07 1359)  Labs: Recent Labs    02/20/24 1128 02/21/24 0421 02/21/24 1738  HGB 14.8 14.4  --   HCT 44.9 43.5  --   PLT 173 172  --   HEPARINUNFRC  --   --  0.23*  CREATININE 0.91 0.95  --     Estimated Creatinine Clearance: 93 mL/min (by C-G formula based on SCr of 0.95 mg/dL).   Medical History: Past Medical History:  Diagnosis Date   AR (allergic rhinitis)    EtOH dependence (HCC) 12/25/2020   Whiskey 6 oz each day and more on the weekends.    Hyperaldosteronism    Hyperglycemia 01/2014   A1c 6.0   Hyperlipidemia    Hypertension    Difficult to control - ? Secondary to Hyperaldosteronism.   Hypokalemia    Rectal bleed 2001   Thyroid  nodule 01/2014     Assessment: 69 year old male presented with shortness of breath concerning for acute exacerbation of heart failure. Noted history of hypertension with presenting BP 163/107, pt reports compliance to PTA regimen although no fills noted for past year. Patient noted to also have new onset atrial fibrillation. He reports taking aspirin  81 mg daily. Pharmacy consulted for management of heparin  infusion.  CHA2DS2VASc score 3.  CBC stable. Enoxaparin  for VTE ppx given 1/6 pm.  1st heparin  level SUBtherapeutic on current rate of 1350 units/hr Per RN, no bleeding or issues noted  Goal of Therapy:  Heparin  level 0.3-0.7 units/ml Monitor platelets by anticoagulation protocol: Yes   Plan:  -Increase IV heparin  to rate of 1550 units/hr -Recheck heparin  level in 6 hours -Daily CBC and heparin  level -Continue to follow for long term anticoagulation  plan   Britta Eva Na, PharmD, BCPS Clinical Pharmacist 02/21/2024 7:09 PM        [1] No Known Allergies

## 2024-02-21 NOTE — Consult Note (Addendum)
 "  As below, patient seen and examined.  Briefly he is a 69 year old male with past medical history of hypertension, nonobstructive coronary artery disease on prior CTA, hyperlipidemia, question hyperaldosteronism for evaluation of new onset atrial fibrillation and congestive heart failure.  Patient states that for the past 3 months he has had dyspnea on exertion, orthopnea and mild weight gain of 2 to 3 pounds.  No chest pain, palpitations or syncope.  He presented for further evaluation and cardiology now asked to evaluate.  Chest x-ray with no active disease.  Sodium 141, potassium 3.2, creatinine 0.95, hemoglobin 14.4, proBNP 752, troponin 36 and 36, TSH 0.316.  Electrocardiogram shows atrial fibrillation with left anterior fascicular block and right bundle branch block.  1 new onset congestive heart failure-patient presented with CHF symptoms and has improved with diuresis.  Will await echocardiogram to assess LV function.  For now continue Lasix  and spironolactone  at present dose.  Add Farxiga  10 mg daily.  If LV function reduced will need Entresto.  Hold ACE inhibitor.  Will likely add low-dose beta-blocker later as needed though he has been somewhat bradycardic.  2 newly diagnosed atrial fibrillation-atrial fibrillation could be contributing to his CHF symptoms.  His heart rate is mildly decreased.  Can hold beta-blocker for now and follow on telemetry.  CHA2DS2-VASc is 3.  Will transition to apixaban once all procedures complete.  Would then need either TEE guided cardioversion or cardioversion after therapeutic anticoagulation for 21 consecutive days pending symptoms.  3 hypertension-patient's blood pressure significantly elevated at time of admission.  Follow and advance medications as needed.  4 alcohol abuse-we discussed the importance of discontinuing.  5 noncompliance-he states he is missing his medications at home occasionally.  I discussed the importance of complying.  6 nonobstructive  coronary artery disease-continue statin.  Larry Shallow, MD   Cardiology Consultation  Patient ID: Larry [name] MRN: [id number]; DOB: 1955/05/01  Admit date: 02/20/2024 Date of Consult: 02/21/2024  PCP:  Sun, Vyvyan, MD   [facility] Providers Cardiologist:  Larry Clay, MD     Patient Profile: Larry [name] is a 69 y.o. male with a hx of poorly controlled hypertension, nonobstructive CAD per cardiac CTA 2019, ongoing tobacco use, alcohol dependency, hypercholesterolemia (LDL 47 03/2023), hyperaldosteronism, history of VT, who is being seen 02/21/2024 for the evaluation of CHF at the request of Dr. Christobal.  History of Present Illness: [name].  He presented to the [facility] long emergency department on 02/20/2024 complaining of shortness of breath.  He reported progressively worsening dyspnea over the last 4 months, worsening over the last few weeks, worse with exertion, orthopnea.  BP on arrival 163/107, HR 80, temperature 98 F, SpO2 99% on room air.  Relevant workup in the ED includes: proBNP mildly elevated at 752, TSH low, T4 normal, troponin mildly elevated and flat at 36 ? 36, CBC normal, metabolic panel showed hypokalemia with potassium 3.2, otherwise unremarkable.  CXR showed no active disease.  Echocardiogram ordered, pending today.  EKG showed rate controlled A-Fib with rate 83, no prior history of A-Fib.   [name], [name], [name], [name], spironolactone  25 mg daily.  [name], aspirin  81 mg daily. He was not started on any [facility] per EDP or medicine team.   He is a patient of Dr. Genice,  last seen by Larry Satterfield, NP 12/2020 for follow-up/to reestablish care after not following up for about 2 years.  He was primarily being followed for hypertension.  He does not appear to have followed up since  this time.  [facility] for [facility] care.  [name], lisinopril  10 mg daily, carvedilol  6.25 mg twice daily, aspirin  81 mg daily, spironolactone  25 mg daily, amlodipine  10 mg daily.  Upon review of dispense history there has been no recent fills of amlodipine , carvedilol , lisinopril , spironolactone , potassium supplementation.  The only medication that seems to be filled relatively recently is Crestor .   Patient agrees with history as above. [name], unable to lay flat most nights. He denies any active or prior chest pain. He tells me that he had good urine response to IV Lasix  thus far and feels slightly better than when he came in. We are still waiting on echocardiogram to be performed, will likely add/adjust medications after results depending on LV function. When I inquired about missing medications, he tells me that he has been taking all of his medications, even tho they are not showing up on the dispense report.   Past Medical History:  Diagnosis Date   AR (allergic rhinitis)    EtOH dependence (HCC) 12/25/2020   Whiskey 6 oz each day and more on the weekends.    Hyperaldosteronism    Hyperglycemia 01/2014   A1c 6.0   Hyperlipidemia    Hypertension    Difficult to control - ? Secondary to Hyperaldosteronism.   Hypokalemia    Rectal bleed 2001   Thyroid  nodule 01/2014   Past Surgical History:  Procedure Laterality Date   COLONOSCOPY  2001   ESOPHAGOGASTRODUODENOSCOPY  2001   NM MYOVIEW  LTD  December 2015   EF 50%, No ischemia or infarction   SMALL BOWEL ENTEROSCOPY  2001    Home Medications:  Prior to Admission medications  Medication Sig Start Date End Date Taking? Authorizing Provider  amLODipine  (NORVASC ) 10 MG tablet Take 1 tablet (10 mg total) by mouth daily. 12/25/20  Yes [name] Larry HERO, NP  aspirin  81 MG tablet Take 81 mg by mouth daily.   Yes [provider]  carvedilol  (COREG ) 6.25 MG tablet TAKE 1 TABLET BY MOUTH 2 (TWO) TIMES DAILY. SCHEDULE AN APPOINTMENT FOR FURTHER REFILLS Patient taking differently: Take 6.25 mg by mouth 2 (two) times daily with a meal. 07/26/21  Yes [name] Larry HERO, NP  lisinopril  (ZESTRIL ) 10 MG tablet Take 20 mg by mouth daily.   Yes [provider]  rosuvastatin  (CRESTOR ) 40 MG tablet Take 40 mg by mouth at bedtime.   Yes [provider]  spironolactone  (ALDACTONE ) 25 MG tablet TAKE 1 TABLET BY MOUTH EVERY DAY Patient taking differently: Take 25 mg by mouth daily. 05/20/21  Yes Anner Larry ORN, MD  hydrALAZINE  (APRESOLINE ) 25 MG tablet Take 1 tablet (25 mg total) by mouth 2 (two) times daily. May take an extra 50 mg if blood pressure is greater than 160 as needed Patient not taking: Reported on 02/20/2024 12/25/20 02/20/24  [name] Larry HERO, NP  omeprazole  (PRILOSEC) 20 MG capsule TAKE 1 CAPSULE BY MOUTH TWICE A DAY Patient not taking: Reported on 02/20/2024 04/01/19   Anner Larry ORN, MD  potassium chloride  SA (K-DUR,KLOR-CON ) 20 MEQ tablet Take 2 tablets (40 mEq total) by mouth 3 (three) times daily. Patient not taking:  Reported on 02/20/2024 06/15/16   Anner Larry ORN, MD  rosuvastatin  (CRESTOR ) 40 MG tablet Take 1 tablet (40 mg total) by mouth at bedtime. Patient not taking: Reported on 02/20/2024 12/25/20 02/20/24  Jerilynn Larry HERO, NP   Scheduled Meds:  aspirin   81 mg Oral Daily   enoxaparin  (LOVENOX ) injection  40 mg Subcutaneous QHS   furosemide   40 mg Intravenous Daily   potassium chloride   40 mEq Oral BID   rosuvastatin   40 mg Oral QHS   spironolactone   25 mg Oral Daily   Continuous Infusions:  PRN Meds: acetaminophen  **OR** acetaminophen , bisacodyl , ondansetron  **OR** ondansetron  (ZOFRAN ) IV, mouth rinse, senna-docusate  Allergies:   Allergies[1]  Social History:   Social History   Socioeconomic History   Marital status: Married    Spouse name: Not on file   Number of  children: Not on file   Years of education: Not on file   Highest education level: Not on file  Occupational History   Occupation: [employer]  Tobacco Use   Smoking status: Every Day    Current packs/day: 0.50    Average packs/day: 0.5 packs/day for 40.0 years (20.0 ttl pk-yrs)    Types: Cigarettes   Smokeless tobacco: Never  Substance and Sexual Activity   Alcohol use: Yes    Alcohol/week: 0.0 standard drinks of alcohol    Comment: I just drink on the weekend   Drug use: No   Sexual activity: Not on file  Other Topics Concern   Not on file  Social History Narrative   Lives with wife   Social Drivers of Health   Tobacco Use: High Risk (02/20/2024)   Patient History    Smoking Tobacco Use: Every Day    Smokeless Tobacco Use: Never    Passive Exposure: Not on file  Financial Resource Strain: Not on file  Food Insecurity: No Food Insecurity (02/20/2024)   Epic    Worried About Programme Researcher, Broadcasting/film/video in the Last Year: Never true    Ran Out of Food in the Last Year: Never true  Transportation Needs: No Transportation Needs (02/20/2024)   Epic    Lack of Transportation (Medical): No    Lack of Transportation (Non-Medical): No  Physical Activity: Not on file  Stress: Not on file  Social Connections: Moderately Isolated (02/20/2024)   Social Connection and Isolation Panel    Frequency of Communication with Friends and Family: More than three times a week    Frequency of Social Gatherings with Friends and Family: Once a week    Attends Religious Services: More than 4 times per year    Active Member of [employer] or Organizations: No    Attends Banker Meetings: Never    Marital Status: Widowed  Intimate Partner Violence: Not At Risk (02/20/2024)   Epic    Fear of Current or Ex-Partner: No    Emotionally Abused: No    Physically Abused: No    Sexually Abused: No  Depression (PHQ2-9): Not on file  Alcohol Screen: Not on file  Housing: Low Risk (02/20/2024)   Epic    Unable  to Pay for Housing in the Last Year: No    Number of Times Moved in the Last Year: 0    Homeless in the Last Year: No  Utilities: Not At Risk (02/20/2024)   Epic    Threatened with loss of utilities: No  Health Literacy: Not on file    Family History:   Family History  Problem Relation Age of Onset   Cancer Mother    Heart failure Mother    Diabetes Mother    Hypertension Father    Hyperlipidemia Father    Heart attack Father    Sleep apnea Sister    Hypertension Sister    Thyroid  disease Neg Hx     ROS:  Please see the history of present illness.  All other ROS reviewed and negative.     Physical Exam/Data: Vitals:   02/21/24 0018 02/21/24 0438 02/21/24 0445 02/21/24 0459  BP: 127/73 (!) 133/97    Pulse: 89 (!) 59    Resp: 18 18  (!) 21  Temp: 98.7 F (37.1 C) 99 F (37.2 C)    TempSrc: Oral     SpO2: 97% 97%    Weight:   111.8 kg   Height:        Intake/Output Summary (Last 24 hours) at 02/21/2024 0935 Last data filed at 02/21/2024 0459 Gross per 24 hour  Intake 120 ml  Output 250 ml  Net -130 ml      02/21/2024    4:45 AM 02/20/2024    8:34 PM 12/25/2020    3:26 PM  Last 3 Weights  Weight (lbs) 246 lb 8 oz 246 lb 8 oz 242 lb 3.2 oz  Weight (kg) 111.812 kg 111.812 kg 109.861 kg     Body mass index is 36.4 kg/m.   General:  in no acute distress HEENT: normal Vascular:  Distal pulses 2+ bilaterally Cardiac:  iRRR, distant heart sounds Lungs:  clear but decreased at bases Abd: soft, nontender Ext: no edema Musculoskeletal:  No deformities Skin: warm and dry  Neuro:   no focal abnormalities noted Psych:  Normal affect   EKG:  The EKG was personally reviewed and demonstrates:  rate controlled A-Fib, 83   Telemetry:  Telemetry was personally reviewed and demonstrates:  A-Fib, rates 60s, frequent ~2 second pauses noted   Relevant CV Studies:  Echocardiogram, 02/21/2024 Ordered, pending results  Coronary CTA, 12/02/2017 Coronary artery calcium  score 38.6  Agatston units. This places the patient in the 54th percentile for age and gender, suggesting intermediate risk for future cardiac events. Nonobstructive mild CAD.  Laboratory Data: High Sensitivity Troponin:  No results for input(s): TROPONINIHS in the last 720 hours.  Recent Labs  Lab 02/20/24 1610 02/20/24 1819  TRNPT 36* 36*      Chemistry Recent Labs  Lab 02/20/24 1128 02/20/24 1610 02/21/24 0421  NA 141  --  141  K 3.4*  --  3.2*  CL 105  --  104  CO2 25  --  25  GLUCOSE 109*  --  108*  BUN 19  --  19  CREATININE 0.91  --  0.95  CALCIUM  9.1  --  8.5*  MG  --  2.2 2.2  GFRNONAA >60  --  >60  ANIONGAP 10  --  11    No results for input(s): PROT, ALBUMIN, AST, ALT, ALKPHOS, BILITOT in the last 168 hours. Lipids No results for input(s): CHOL, TRIG, HDL, LABVLDL, LDLCALC, CHOLHDL in the last 168 hours.  Hematology Recent Labs  Lab 02/20/24 1128 02/21/24 0421  WBC 6.4 6.8  RBC 4.92 4.79  HGB 14.8 14.4  HCT 44.9 43.5  MCV 91.3 90.8  MCH 30.1 30.1  MCHC 33.0 33.1  RDW 14.2 14.3  PLT 173 172   Thyroid   Recent Labs  Lab 02/20/24 1610 02/20/24 1628  TSH 0.316*  --  FREET4  --  1.21    BNP Recent Labs  Lab 02/20/24 1610  PROBNP 752.0*    DDimer No results for input(s): DDIMER in the last 168 hours.  Radiology/Studies:  DG Chest 2 View Result Date: 02/20/2024 CLINICAL DATA:  Shortness of breath past few months. EXAM: CHEST - 2 VIEW COMPARISON:  12/01/2017 FINDINGS: Lungs are adequately inflated and otherwise clear. Cardiomediastinal silhouette and remainder of the exam is unchanged. IMPRESSION: No active cardiopulmonary disease. Electronically Signed   By: Toribio Agreste M.D.   On: 02/20/2024 11:20   Assessment and Plan:  Uncontrolled hypertension Previous home meds: lisinopril  10 mg daily, carvedilol  6.25 mg BID, spironolactone  25 mg daily, amlodipine  10 mg daily Dispense report shows no recent fill history of any  antihypertensive therapy Presented with BP 163/107, improving most recently as low as 127/73 [name] Currently on [name] Currently on spironolactone  25 mg daily Will follow response, titrate up as needed, adjust pending echocardiogram results  Shortness of breath Elevated proBNP Home medications: lisinopril  10 mg daily, carvedilol  6.25 mg BID, spironolactone  25 mg daily Dispense report shows no recent fill history of any these medications Echo from 03/2013 showed LVEF 55 to 60%, G1 DD proBNP mildly elevated to 752 CXR clear Suspect could develop reduced LV function in the setting of uncontrolled hypertension Renal function normal Not significantly volume overloaded on exam Reports good UOP, slight improvement in symptoms  Pending updated echocardiogram, follow results [name] --follow response, renal function Started on [name], this has been held due to pauses, continue to monitor on telemetry Started on spironolactone  25 mg daily Primary team ordered lisinopril , I have discontinued this, with plans to likely start ARB/ARNI in the future (he has not received dose since being in the hospital) Continue strict I's and O's, daily weights, daily BMPs  A-Fib, new onset  No prior documented history of A-Fib EKG showed rate controlled A-Fib with rates 80s Does not appear to have been taking any home medications for HTN given dispense history  Was not started on any anticoagulation per primary team Remains in rate controlled A-Fib Alerts on telemetry for bradycardia are in the setting of 2s pauses Patient asymptomatic and nothing > 3 second Started on [name], this has been held due to pauses, continue to monitor on telemetry Start IV heparin  for [facility], once confirm no procedures needed can switch to DOAC  Nonobstructive CAD per CCTA 2019 Hyperlipidemia  LDL 47 as of 03/2023  CCTA: Calcium  score 38.6, 54th  percentile for age/gender, mild nonobstructive CAD Home meds: Crestor  40 mg daily, ASA 81 mg daily  Patient denies any prior or current chest pain Troponin mildly elevated and flat 36 ? 36  EKG does not show any signs of acute ischemia Currently on Crestor  40 mg daily Currently on aspirin  81 mg daily  Per primary Electrolyte disturbances Hyperaldosteronism GERD Prediabetes   Risk Assessment/Risk Scores:      [place]  Heart Association (NYHA) Functional Class NYHA Class II  CHA2DS2-VASc Score = 3   This indicates a 3.2% annual risk of stroke. The patient's score is based upon: CHF History: 1 HTN History: 1 Diabetes History: 0 Stroke History: 0 Vascular Disease History: 0 Age Score: 1 Gender Score: 0     For questions or updates, please contact [facility] Please consult [email or web address] for contact info under   Signed, Waddell DELENA Donath,  PA-C  02/21/2024 9:35 AM     [1] No Known Allergies  "

## 2024-02-21 NOTE — Progress Notes (Signed)
 " PROGRESS NOTE Larry [name]  FMW:[id number] DOB: [birth year] DOA: 02/20/2024 PCP: Sun, Vyvyan, MD  Brief Narrative/Hospital Course: Larry [name] is a 69 y.o. male with PMH of  chronic diastolic HF, hyperaldosteronism, HTN, HLD, nonobstructive CAD, GERD, hypokalemia and obesity who presented to the ED for evaluation of shortness of breath progressive over the last 4 months that has been worse over the last few weeks and w/ orthopnea, worsening dyspnea on exertion and fatigue but no chest pain, fever, chills, nausea, vomiting, abdominal pain or dysuria. In  ED: Initial vitals show patient afebrile, HR 60-80s, SBP 130-160s, SpO2 99% on room air. Initial labs significant for K+ 3.4, proBNP 752, troponin 36, TSH 0.316, otherwise normal renal function and CBC.  EKG shows A-fib with RBBB and LAFB. CXR shows no active disease.  Pt received IV Lasix  40 mg x 1, cardiology was consulted and admitted for further management  Subjective: Seen and examined today Reports he feels much better overall today HR down to 25 during night, sleeping- HR 25-80, has been asymptomatic Labs hypokalemia 3.2 stable CBC troponin flat 36   Assessment and plan:  Acute on chronic diastolic HF Presenting with progressive shortness of breath/orthopnea/DOE which 2 months Last EF 55 to 60% with moderate to severe dilated LA. on admission BNP 752 with flat troponin EKG A-fib slow heart rate Treating for CHF exacerbation will continue IV Lasix  cardiology has been consulted, update echo pending GDMT-Toprol  25, Aldactone  25- pending echo per cardiology. Cont to monitor daily I/O,weight, electrolytes and net balance as below.Keep on  salt/fluid restricted diet and monitor in tele. Net IO Since Admission: 230 mL [02/21/24 1328]  Filed Weights   02/20/24 2034 02/21/24 0445  Weight: 111.8 kg 111.8 kg    Recent Labs  Lab 02/20/24 1128 02/20/24 1610 02/21/24 0421  PROBNP  --  752.0*  --   BUN 19  --  19  CREATININE 0.91  --   0.95  K 3.4*  --  3.2*  MG  --  2.2 2.2    Atrial fibrillation with slow ventricular response rate, new onset: Heart rate variable from 25 to 80s, will check EKG monitor electrolytes hold Toprol  for now. Not on anticoagulation -placed on heparin .  Cardiology following.  Resistant hypertension Hyperaldosteronism: BP fairly stable, history of resistant hypertension in the setting of hyperaldosteronism. Hold Toprol  due to bradycardia.  Continue Aldactone , cardiac consulted Per med rec, patient has not had any recent refills of his BP meds  Hypokalemia: Continue K-Dur twice daily   HLD Continue rosuvastatin    CAD Elevated troponin flat likely demand ischemia from CHF: History of mild nonobstructive CAD based on coronary CTA in 2019 Continue aspirin  and rosuvastatin    Class II Obesity w/ Body mass index is 36.4 kg/m.: Will benefit with PCP follow-up, weight loss,healthy lifestyle  DVT prophylaxis: Heparin  drip Code Status:   Code Status: Full Code Family Communication: plan of care discussed with patient at bedside. Patient status is: Remains hospitalized because of severity of illness Level of care: Telemetry   Dispo: The patient is from: home by himself            Anticipated disposition: TBD Objective: Vitals last 24 hrs: Vitals:   02/21/24 0018 02/21/24 0438 02/21/24 0445 02/21/24 0459  BP: 127/73 (!) 133/97    Pulse: 89 (!) 59    Resp: 18 18  (!) 21  Temp: 98.7 F (37.1 C) 99 F (37.2 C)    TempSrc: Oral  SpO2: 97% 97%    Weight:   111.8 kg   Height:        Physical Examination: General exam: alert awake, oriented, older than stated age HEENT:Oral mucosa moist, Ear/Nose WNL grossly Respiratory system: Bilaterally clear BS,no use of accessory muscle Cardiovascular system: S1 & S2 +, No JVD. Gastrointestinal system: Abdomen soft,NT,ND, BS+ Nervous System: Alert, awake, moving all extremities,and following commands. Extremities: extremities warm, leg edema  + Skin: Warm, no rashes MSK: Normal muscle bulk,tone, power   Medications reviewed:  Scheduled Meds:  dapagliflozin  propanediol  10 mg Oral Daily   furosemide   40 mg Intravenous Daily   potassium chloride   40 mEq Oral BID   rosuvastatin   40 mg Oral QHS   spironolactone   25 mg Oral Daily   Continuous Infusions:  heparin  1,350 Units/hr (02/21/24 1100)   Diet: Diet Order             Diet Heart Room service appropriate? Yes; Fluid consistency: Thin  Diet effective now                   Data Reviewed: I have personally reviewed following labs and imaging studies ( see epic result tab) CBC: Recent Labs  Lab 02/20/24 1128 02/21/24 0421  WBC 6.4 6.8  HGB 14.8 14.4  HCT 44.9 43.5  MCV 91.3 90.8  PLT 173 172   CMP: Recent Labs  Lab 02/20/24 1128 02/20/24 1610 02/21/24 0421  NA 141  --  141  K 3.4*  --  3.2*  CL 105  --  104  CO2 25  --  25  GLUCOSE 109*  --  108*  BUN 19  --  19  CREATININE 0.91  --  0.95  CALCIUM  9.1  --  8.5*  MG  --  2.2 2.2   GFR: Estimated Creatinine Clearance: 93 mL/min (by C-G formula based on SCr of 0.95 mg/dL). No results for input(s): AST, ALT, ALKPHOS, BILITOT, PROT, ALBUMIN in the last 168 hours. No results for input(s): LIPASE, AMYLASE in the last 168 hours. No results for input(s): AMMONIA in the last 168 hours. Coagulation Profile: No results for input(s): INR, PROTIME in the last 168 hours. Unresulted Labs (From admission, onward)     Start     Ordered   02/22/24 0500  Basic metabolic panel with GFR  Daily,   R      02/21/24 0927   02/22/24 0500  Heparin  level (unfractionated)  Daily,   R      02/21/24 1150   02/22/24 0500  CBC  Daily,   R      02/21/24 1150   02/21/24 1700  Heparin  level (unfractionated)  Once-Timed,   TIMED        02/21/24 1150           Antimicrobials/Microbiology: Anti-infectives (From admission, onward)    None      No results found for: SDES, SPECREQUEST, CULT,  REPTSTATUS  Procedures:    Mennie LAMY, MD [facility] 02/21/2024, 1:28 PM   "

## 2024-02-21 NOTE — Hospital Course (Addendum)
 Larry [name] is a 69 y.o. male with PMH of  chronic diastolic HF, hyperaldosteronism, HTN, HLD, nonobstructive CAD, GERD, hypokalemia and obesity who presented to the ED for evaluation of shortness of breath progressive over the last 4 months that has been worse over the last few weeks and w/ orthopnea, worsening dyspnea on exertion and fatigue but no chest pain, fever, chills, nausea, vomiting, abdominal pain or dysuria. In  ED: Initial vitals show patient afebrile, HR 60-80s, SBP 130-160s, SpO2 99% on room air. Initial labs significant for K+ 3.4, proBNP 752, troponin 36, TSH 0.316, otherwise normal renal function and CBC.  EKG shows A-fib with RBBB and LAFB. CXR shows no active disease.  Pt received IV Lasix  40 mg x 1, cardiology was consulted and admitted for further management Echo obtained showing normal EF, seen by cardiology at this time diuresed and volume status improved, changed to p.o. Lasix , Aldactone  Avapro  and Eliquis  and okay for discharge home  Subjective: Seen and examined  Feels much improved no more shortness of breath.  Ambulating in the hallway.  Eager to go home today Remains afebrile heart rate in 50s to 80s, BP stable on room air Labs w/ stable electrolytes and CBC  Discharge Diagnoses:   Acute on chronic diastolic HF Presenting with progressive shortness of breath/orthopnea/DOE which 2 months Last EF 55 to 60% with moderate to severe dilated LA. on admission BNP 752 with flat troponin EKG A-fib slow heart rate Repeat echo showed EF 60 to 65%, no RWMA moderate concentric LVH diastolic parameters indeterminate RV SF is moderately reduced in size moderately enlarged with mildly elevated PASP Cardiology following input changing to p.o. Lasix , will continue Aldactone  Avapro  Farxiga   Cont to chf DC instruction follow-up with cardiology as outpatient  Cont 2g salt/ 1.8l fluid restricted dietNet IO Since Admission: 172.46 mL [02/22/24 1036]  Filed Weights   02/20/24 2034  02/21/24 0445 02/22/24 0500  Weight: 111.8 kg 111.8 kg 112 kg    Recent Labs  Lab 02/20/24 1128 02/20/24 1610 02/21/24 0421 02/22/24 0217  PROBNP  --  752.0*  --   --   BUN 19  --  19 18  CREATININE 0.91  --  0.95 1.00  K 3.4*  --  3.2* 3.6  MG  --  2.2 2.2  --     Atrial fibrillation new osnet: Heart rate remains stable.cont po eliquis   Resistant hypertension Hyperaldosteronism: BP fairly stable, history of resistant hypertension in the setting of hyperaldosteronism.  Not on Toprol  due to bradycardia continue Aldactone , Farxiga  Continue Aldactone . Per med rec, patient has not had any recent refills of his BP meds  Hypokalemia: Continue replacement    HLD Continue rosuvastatin    CAD Elevated troponin flat likely demand ischemia from CHF: History of mild nonobstructive CAD based on coronary CTA in 2019 Continue aspirin  and rosuvastatin   Per-Diabetes A1c 6.0: Advised diet controlled, cont farxiga   Class II Obesity w/ Body mass index is 36.46 kg/m.: Will benefit with PCP follow-up, weight loss,healthy lifestyle  DVT prophylaxis: Heparin  drip Code Status:   Code Status: Full Code Family Communication: plan of care discussed with patient at bedside. Patient status is: Remains hospitalized because of severity of illness Level of care: Telemetry   Dispo: The patient is from: home by himself            Anticipated disposition:  home today Objective: Vitals last 24 hrs: Vitals:   02/22/24 0209 02/22/24 0209 02/22/24 0500 02/22/24 0621  BP:  ROLLEN)  143/95  (!) 141/93  Pulse:  84  (!) 59  Resp: 18 18  18   Temp:  98.4 F (36.9 C)  98.4 F (36.9 C)  TempSrc:  Oral  Oral  SpO2:  97%  98%  Weight:   112 kg   Height:        Physical Examination: General exam: AAOX3 HEENT:Oral mucosa moist, Ear/Nose WNL grossly Respiratory system: Bilaterally clear BS,no use of accessory muscle Cardiovascular system: S1 & S2 +, No JVD. Gastrointestinal system: Abdomen soft,NT,ND,  BS+ Nervous System: Alert, awake, moving all extremities,and following commands. Extremities: extremities warm, leg edema + Skin: Warm, no rashes MSK: Normal muscle bulk,tone, power

## 2024-02-21 NOTE — Plan of Care (Signed)

## 2024-02-21 NOTE — Telephone Encounter (Signed)
 Pharmacy Patient Advocate Encounter  Insurance verification completed.    The patient is insured through [facility]. Patient has [employer], may use a copay card, and/or apply for patient assistance if available.    Ran test claim for Eliquis 5mg  tablet and the current 30 day co-pay is $50.   This test claim was processed through [employer]- copay amounts may vary at other pharmacies due to [employer], or as the patient moves through the different stages of their insurance plan.

## 2024-02-21 NOTE — Progress Notes (Addendum)
 PHARMACY - ANTICOAGULATION CONSULT NOTE  Pharmacy Consult for heparin  Indication: atrial fibrillation  Allergies[1]  Patient Measurements: Height: 5' 9 (175.3 cm) Weight: 111.8 kg (246 lb 8 oz) IBW/kg (Calculated) : 70.7 HEPARIN  DW (KG): 95.4  Vital Signs: Temp: 99 F (37.2 C) (01/07 0438) Temp Source: Oral (01/07 0018) BP: 133/97 (01/07 0438) Pulse Rate: 59 (01/07 0438)  Labs: Recent Labs    02/20/24 1128 02/21/24 0421  HGB 14.8 14.4  HCT 44.9 43.5  PLT 173 172  CREATININE 0.91 0.95    Estimated Creatinine Clearance: 93 mL/min (by C-G formula based on SCr of 0.95 mg/dL).   Medical History: Past Medical History:  Diagnosis Date   AR (allergic rhinitis)    EtOH dependence (HCC) 12/25/2020   Whiskey 6 oz each day and more on the weekends.    Hyperaldosteronism    Hyperglycemia 01/2014   A1c 6.0   Hyperlipidemia    Hypertension    Difficult to control - ? Secondary to Hyperaldosteronism.   Hypokalemia    Rectal bleed 2001   Thyroid  nodule 01/2014     Assessment: 69 year old male presented with shortness of breath concerning for acute exacerbation of heart failure. Noted history of hypertension with presenting BP 163/107, pt reports compliance to PTA regimen although no fills noted for past year. Patient noted to also have new onset atrial fibrillation. He reports taking aspirin  81 mg daily. Pharmacy consulted for management of heparin  infusion.  CHA2DS2VASc score 3.  CBC stable. Enoxaparin  for VTE ppx given 1/6 pm.  Goal of Therapy:  Heparin  level 0.3-0.7 units/ml Monitor platelets by anticoagulation protocol: Yes   Plan:  -Heparin  2000 unit bolus (reduced initial bolus for enoxaparin  given last night) -Start heparin  infusion at 1350 units/hr -Check heparin  level ~ 6 hours after start of infusion -Daily CBC and heparin  level -Continue to follow for long term anticoagulation plan   Stefano MARLA Bologna, PharmD, BCPS Clinical Pharmacist 02/21/2024 9:51  AM       [1] No Known Allergies

## 2024-02-21 NOTE — Progress Notes (Signed)
 Mobility Specialist - Progress Note   02/21/24 1551  Mobility  Activity Ambulated independently  Level of Assistance Independent after set-up  Assistive Device None  Distance Ambulated (ft) 350 ft  Range of Motion/Exercises Active  Activity Response Tolerated well  Mobility visit 1 Mobility  Mobility Specialist Start Time (ACUTE ONLY) 1540  Mobility Specialist Stop Time (ACUTE ONLY) 1551  Mobility Specialist Time Calculation (min) (ACUTE ONLY) 11 min   Pt was found in bed and agreeable to mobilize. No complaints. At EOS returned to sit EOB with all needs met. Call bell in reach.   Erminio Leos,  Mobility Specialist Can be reached via Secure Chat

## 2024-02-21 NOTE — Progress Notes (Signed)
" °  Echocardiogram 2D Echocardiogram has been performed.  Larry [name] 02/21/2024, 10:15 AM "

## 2024-02-22 ENCOUNTER — Other Ambulatory Visit: Payer: Self-pay | Admitting: Cardiology

## 2024-02-22 ENCOUNTER — Other Ambulatory Visit (HOSPITAL_COMMUNITY): Payer: Self-pay

## 2024-02-22 ENCOUNTER — Encounter: Payer: Self-pay | Admitting: Emergency Medicine

## 2024-02-22 DIAGNOSIS — I1A0 Resistant hypertension: Secondary | ICD-10-CM

## 2024-02-22 DIAGNOSIS — I5033 Acute on chronic diastolic (congestive) heart failure: Secondary | ICD-10-CM | POA: Diagnosis not present

## 2024-02-22 LAB — BASIC METABOLIC PANEL WITH GFR
Anion gap: 11 (ref 5–15)
BUN: 18 mg/dL (ref 8–23)
CO2: 25 mmol/L (ref 22–32)
Calcium: 9.2 mg/dL (ref 8.9–10.3)
Chloride: 104 mmol/L (ref 98–111)
Creatinine, Ser: 1 mg/dL (ref 0.61–1.24)
GFR, Estimated: >60 mL/min
Glucose, Bld: 108 mg/dL — ABNORMAL HIGH (ref 70–99)
Potassium: 3.6 mmol/L (ref 3.5–5.1)
Sodium: 140 mmol/L (ref 135–145)

## 2024-02-22 LAB — CBC
HCT: 44.2 % (ref 39.0–52.0)
Hemoglobin: 14.8 g/dL (ref 13.0–17.0)
MCH: 30.4 pg (ref 26.0–34.0)
MCHC: 33.5 g/dL (ref 30.0–36.0)
MCV: 90.8 fL (ref 80.0–100.0)
Platelets: 182 K/uL (ref 150–400)
RBC: 4.87 MIL/uL (ref 4.22–5.81)
RDW: 14.2 % (ref 11.5–15.5)
WBC: 6.3 K/uL (ref 4.0–10.5)
nRBC: 0 % (ref 0.0–0.2)

## 2024-02-22 LAB — HEPARIN LEVEL (UNFRACTIONATED): Heparin Unfractionated: 0.38 [IU]/mL (ref 0.30–0.70)

## 2024-02-22 MED ORDER — APIXABAN 5 MG PO TABS
5.0000 mg | ORAL_TABLET | Freq: Two times a day (BID) | ORAL | Status: DC
  Administered 2024-02-22: 5 mg via ORAL
  Filled 2024-02-22: qty 1

## 2024-02-22 MED ORDER — IRBESARTAN 150 MG PO TABS
150.0000 mg | ORAL_TABLET | Freq: Every day | ORAL | Status: DC
  Administered 2024-02-22: 150 mg via ORAL
  Filled 2024-02-22: qty 1

## 2024-02-22 MED ORDER — APIXABAN 5 MG PO TABS
5.0000 mg | ORAL_TABLET | Freq: Two times a day (BID) | ORAL | 0 refills | Status: AC
  Filled 2024-02-22: qty 60, 30d supply, fill #0

## 2024-02-22 MED ORDER — FUROSEMIDE 10 MG/ML IJ SOLN: 40.0000 mg | Freq: Every day | INTRAMUSCULAR | Status: DC

## 2024-02-22 MED ORDER — POTASSIUM CHLORIDE CRYS ER 20 MEQ PO TBCR
40.0000 meq | EXTENDED_RELEASE_TABLET | Freq: Two times a day (BID) | ORAL | 0 refills | Status: AC
  Filled 2024-02-22: qty 30, 8d supply, fill #0

## 2024-02-22 MED ORDER — FUROSEMIDE 20 MG PO TABS
20.0000 mg | ORAL_TABLET | Freq: Every day | ORAL | 0 refills | Status: AC
  Filled 2024-02-22: qty 30, 30d supply, fill #0

## 2024-02-22 MED ORDER — FUROSEMIDE 20 MG PO TABS: 20.0000 mg | ORAL_TABLET | Freq: Every day | ORAL | Status: DC

## 2024-02-22 MED ORDER — POTASSIUM CHLORIDE CRYS ER 20 MEQ PO TBCR
40.0000 meq | EXTENDED_RELEASE_TABLET | Freq: Two times a day (BID) | ORAL | Status: AC
  Administered 2024-02-22: 40 meq via ORAL
  Filled 2024-02-22: qty 2

## 2024-02-22 MED ORDER — DAPAGLIFLOZIN PROPANEDIOL 10 MG PO TABS
10.0000 mg | ORAL_TABLET | Freq: Every day | ORAL | 0 refills | Status: AC
  Filled 2024-02-22: qty 30, 30d supply, fill #0

## 2024-02-22 MED ORDER — FUROSEMIDE 10 MG/ML IJ SOLN
40.0000 mg | Freq: Every day | INTRAMUSCULAR | Status: AC
  Administered 2024-02-22: 40 mg via INTRAVENOUS

## 2024-02-22 MED ORDER — IRBESARTAN 150 MG PO TABS
150.0000 mg | ORAL_TABLET | Freq: Every day | ORAL | 0 refills | Status: AC
  Filled 2024-02-22: qty 30, 30d supply, fill #0

## 2024-02-22 NOTE — Progress Notes (Signed)
 Discharge Medications delivered from TOC meds to bed [facility] outpatient pharmacy by this RN.

## 2024-02-22 NOTE — Progress Notes (Signed)
" °   02/22/24 1114  TOC Brief Assessment  Insurance and Status Reviewed  Patient has primary care physician Yes  Home environment has been reviewed home with self  Prior level of function: indepednent  Prior/Current Home Services No current home services  Social Drivers of Health Review SDOH reviewed no interventions necessary  Readmission risk has been reviewed Yes  Transition of care needs no transition of care needs at this time    "

## 2024-02-22 NOTE — Plan of Care (Signed)
 Patient alert, IV site with heparin  infusion noted bleeding, dressing changed, no active bleeding noted afterward. On call provider and pharmacist notified. Patient denies pain or discomfort. Will continue plan of care.  Problem: Clinical Measurements: Goal: Diagnostic test results will improve Outcome: Progressing

## 2024-02-22 NOTE — Discharge Instructions (Signed)

## 2024-02-22 NOTE — Progress Notes (Signed)
 Ordering BMET to be completed in 1 week post-discharge

## 2024-02-22 NOTE — Progress Notes (Signed)
 Duplicate

## 2024-02-22 NOTE — Discharge Summary (Signed)
 Physician Discharge Summary  [name] FMW:[id number] DOB: 13-Aug-1955 DOA: 02/20/2024  PCP: Sun, Vyvyan, MD  Admit date: 02/20/2024 Discharge date: 02/22/2024 Recommendations for Outpatient Follow-up:  Follow up with PCP in 1 weeks-call for appointment Please obtain BMP/CBC in one-2 weeks. Follow-up with cardiology   Discharge Dispo: Home Discharge Condition: Stable Code Status:   Code Status: Full Code Diet recommendation:  Diet Order             Diet - low sodium heart healthy           Diet Carb Modified           Diet Heart Room service appropriate? Yes; Fluid consistency: Thin  Diet effective now                    Brief/Interim Summary: Larry [name] is a 69 y.o. male with PMH of  chronic diastolic HF, hyperaldosteronism, HTN, HLD, nonobstructive CAD, GERD, hypokalemia and obesity who presented to the ED for evaluation of shortness of breath progressive over the last 4 months that has been worse over the last few weeks and w/ orthopnea, worsening dyspnea on exertion and fatigue but no chest pain, fever, chills, nausea, vomiting, abdominal pain or dysuria. In  ED: Initial vitals show patient afebrile, HR 60-80s, SBP 130-160s, SpO2 99% on room air. Initial labs significant for K+ 3.4, proBNP 752, troponin 36, TSH 0.316, otherwise normal renal function and CBC.  EKG shows A-fib with RBBB and LAFB. CXR shows no active disease.  Pt received IV Lasix  40 mg x 1, cardiology was consulted and admitted for further management Echo obtained showing normal EF, seen by cardiology at this time diuresed and volume status improved, changed to p.o. Lasix , Aldactone  Avapro  and Eliquis  and okay for discharge home  Subjective: Seen and examined  Feels much improved no more shortness of breath.  Ambulating in the hallway.  Eager to go home today Remains afebrile heart rate in 50s to 80s, BP stable on room air Labs w/ stable electrolytes and CBC  Discharge Diagnoses:   Acute on chronic  diastolic HF Presenting with progressive shortness of breath/orthopnea/DOE which 2 months Last EF 55 to 60% with moderate to severe dilated LA. on admission BNP 752 with flat troponin EKG A-fib slow heart rate Repeat echo showed EF 60 to 65%, no RWMA moderate concentric LVH diastolic parameters indeterminate RV SF is moderately reduced in size moderately enlarged with mildly elevated PASP Cardiology following input changing to p.o. Lasix , will continue Aldactone  Avapro  Farxiga   Cont to chf DC instruction follow-up with cardiology as outpatient  Cont 2g salt/ 1.8l fluid restricted dietNet IO Since Admission: 172.46 mL [02/22/24 1036]  Filed Weights   02/20/24 2034 02/21/24 0445 02/22/24 0500  Weight: 111.8 kg 111.8 kg 112 kg    Recent Labs  Lab 02/20/24 1128 02/20/24 1610 02/21/24 0421 02/22/24 0217  PROBNP  --  752.0*  --   --   BUN 19  --  19 18  CREATININE 0.91  --  0.95 1.00  K 3.4*  --  3.2* 3.6  MG  --  2.2 2.2  --     Atrial fibrillation new osnet: Heart rate remains stable.cont po eliquis   Resistant hypertension Hyperaldosteronism: BP fairly stable, history of resistant hypertension in the setting of hyperaldosteronism.  Not on Toprol  due to bradycardia continue Aldactone , Farxiga  Continue Aldactone . Per med rec, patient has not had any recent refills of his BP meds  Hypokalemia: Continue replacement  HLD Continue rosuvastatin    CAD Elevated troponin flat likely demand ischemia from CHF: History of mild nonobstructive CAD based on coronary CTA in 2019 Continue aspirin  and rosuvastatin   Per-Diabetes A1c 6.0: Advised diet controlled, cont farxiga   Class II Obesity w/ Body mass index is 36.46 kg/m.: Will benefit with PCP follow-up, weight loss,healthy lifestyle  DVT prophylaxis: Heparin  drip Code Status:   Code Status: Full Code Family Communication: plan of care discussed with patient at bedside. Patient status is: Remains hospitalized because of severity of  illness Level of care: Telemetry   Dispo: The patient is from: home by himself            Anticipated disposition:  home today Objective: Vitals last 24 hrs: Vitals:   02/22/24 0209 02/22/24 0209 02/22/24 0500 02/22/24 0621  BP:  (!) 143/95  (!) 141/93  Pulse:  84  (!) 59  Resp: 18 18  18   Temp:  98.4 F (36.9 C)  98.4 F (36.9 C)  TempSrc:  Oral  Oral  SpO2:  97%  98%  Weight:   112 kg   Height:        Physical Examination: General exam: AAOX3 HEENT:Oral mucosa moist, Ear/Nose WNL grossly Respiratory system: Bilaterally clear BS,no use of accessory muscle Cardiovascular system: S1 & S2 +, No JVD. Gastrointestinal system: Abdomen soft,NT,ND, BS+ Nervous System: Alert, awake, moving all extremities,and following commands. Extremities: extremities warm, leg edema + Skin: Warm, no rashes MSK: Normal muscle bulk,tone, power     Discharge Instructions     (HEART FAILURE PATIENTS) Call MD:  Anytime you have any of the following symptoms: 1) 3 pound weight gain in 24 hours or 5 pounds in 1 week 2) shortness of breath, with or without a dry hacking cough 3) swelling in the hands, feet or stomach 4) if you have to sleep on extra pillows at night in order to breathe.   Complete by: As directed    Ambulatory Referral for Lung Cancer Scre   Complete by: As directed    Diet - low sodium heart healthy   Complete by: As directed    1800 cc fluid restriction   Diet Carb Modified   Complete by: As directed    Discharge instructions   Complete by: As directed    Please call call MD or return to ER for similar or worsening recurring problem that brought you to hospital or if any fever,nausea/vomiting,abdominal pain, uncontrolled pain, chest pain,  shortness of breath or any other alarming symptoms.  Please follow-up your doctor as instructed in a week time and call the office for appointment.  Please avoid alcohol, smoking, or any other illicit substance and maintain healthy habits  including taking your regular medications as prescribed.  You were cared for by a hospitalist during your hospital stay. If you have any questions about your discharge medications or the care you received while you were in the hospital after you are discharged, you can call the unit and ask to speak with the hospitalist on call if the hospitalist that took care of you is not available.  Once you are discharged, your primary care physician will handle any further medical issues. Please note that NO REFILLS for any discharge medications will be authorized once you are discharged, as it is imperative that you return to your primary care physician (or establish a relationship with a primary care physician if you do not have one) for your aftercare needs so that they can reassess your  need for medications and monitor your lab values   Increase activity slowly   Complete by: As directed       Allergies as of 02/22/2024   No Known Allergies      Medication List     STOP taking these medications    amLODipine  10 MG tablet Commonly known as: NORVASC    aspirin  81 MG tablet   carvedilol  6.25 MG tablet Commonly known as: COREG    hydrALAZINE  25 MG tablet Commonly known as: APRESOLINE    lisinopril  10 MG tablet Commonly known as: ZESTRIL        TAKE these medications    apixaban  5 MG Tabs tablet Commonly known as: ELIQUIS  Take 1 tablet (5 mg total) by mouth 2 (two) times daily.   dapagliflozin  propanediol 10 MG Tabs tablet Commonly known as: FARXIGA  Take 1 tablet (10 mg total) by mouth daily. Start taking on: February 23, 2024   furosemide  20 MG tablet Commonly known as: LASIX  Take 1 tablet (20 mg total) by mouth daily. Start taking on: February 23, 2024   irbesartan  150 MG tablet Commonly known as: AVAPRO  Take 1 tablet (150 mg total) by mouth daily.   omeprazole  20 MG capsule Commonly known as: PRILOSEC TAKE 1 CAPSULE BY MOUTH TWICE A DAY   potassium chloride  SA 20 MEQ  tablet Commonly known as: KLOR-CON  M Take 2 tablets (40 mEq total) by mouth 2 (two) times daily. What changed: when to take this   rosuvastatin  40 MG tablet Commonly known as: CRESTOR  Take 40 mg by mouth at bedtime. What changed: Another medication with the same name was removed. Continue taking this medication, and follow the directions you see here.   spironolactone  25 MG tablet Commonly known as: ALDACTONE  TAKE 1 TABLET BY MOUTH EVERY DAY        Follow-up Information     Sun, Vyvyan, MD Follow up in 1 week(s).   Specialty: Family Medicine Contact information: [phone] [place] [address] [phone]         Anner Alm ORN, MD. Call.   Specialty: Cardiology Contact information: [address] [place] [address] [phone]                Allergies[1]  The results of significant diagnostics from this hospitalization (including imaging, microbiology, ancillary and laboratory) are listed below for reference.    Microbiology: No results found for this or any previous visit (from the past 240 hours).  Procedures/Studies: ECHOCARDIOGRAM COMPLETE Result Date: 02/21/2024    ECHOCARDIOGRAM REPORT   Patient Name:   Larry [name] Date of Exam: 02/21/2024 Medical Rec #:  [id number]      Height:       69.0 in Accession #:    [id number]     Weight:       246.5 lb Date of Birth:  10/27/1955     BSA:          2.258 m Patient Age:    68 years       BP:           133/97 mmHg Patient Gender: M              HR:           76 bpm. Exam Location:  Inpatient Procedure: 2D Echo, Cardiac Doppler, Color Doppler and Strain Analysis (Both            Spectral and Color Flow Doppler were utilized during procedure). Indications:  R06.02 SOB  History:        Patient has prior history of Echocardiogram examinations, most                 recent 01/29/2014. CHF, Abnormal ECG, Arrythmias:Atrial                 Fibrillation and VT,  Signs/Symptoms:Dizziness/Lightheadedness;                 Risk Factors:Hypertension and Current Smoker. ETOH.  Sonographer:    Ellouise Mose RDCS Referring Phys: [id number] CLARETTA HERO [facility]  Sonographer Comments: Technically difficult study due to poor echo windows and patient is obese. Image acquisition challenging due to patient body habitus. IMPRESSIONS  1. Left ventricular ejection fraction, by estimation, is 60 to 65%. The left ventricle has normal function. The left ventricle has no regional wall motion abnormalities. The left ventricular internal cavity size was mildly dilated. There is moderate concentric left ventricular hypertrophy. Left ventricular diastolic parameters are indeterminate. The average left ventricular global longitudinal strain is -17.9 %. The global longitudinal strain is normal.  2. Right ventricular systolic function is moderately reduced. The right ventricular size is moderately enlarged. There is mildly elevated pulmonary artery systolic pressure. The estimated right ventricular systolic pressure is 43.3 mmHg.  3. Left atrial size was moderately dilated.  4. Right atrial size was moderately dilated.  5. The mitral valve is normal in structure. Mild mitral valve regurgitation. No evidence of mitral stenosis.  6. The aortic valve is tricuspid. Aortic valve regurgitation is not visualized. No aortic stenosis is present.  7. Aortic dilatation noted. There is borderline dilatation of the aortic root, measuring 38 mm. There is borderline dilatation of the ascending aorta, measuring 38 mm.  8. The inferior vena cava is dilated in size with <50% respiratory variability, suggesting right atrial pressure of 15 mmHg. FINDINGS  Left Ventricle: Left ventricular ejection fraction, by estimation, is 60 to 65%. The left ventricle has normal function. The left ventricle has no regional wall motion abnormalities. The average left ventricular global longitudinal strain is -17.9 %. Strain was performed and the  global longitudinal strain is normal. The left ventricular internal cavity size was mildly dilated. There is moderate concentric left ventricular hypertrophy. Left ventricular diastolic parameters are indeterminate. Right Ventricle: The right ventricular size is moderately enlarged. No increase in right ventricular wall thickness. Right ventricular systolic function is moderately reduced. There is mildly elevated pulmonary artery systolic pressure. The tricuspid regurgitant velocity is 2.66 m/s, and with an assumed right atrial pressure of 15 mmHg, the estimated right ventricular systolic pressure is 43.3 mmHg. Left Atrium: Left atrial size was moderately dilated. Right Atrium: Right atrial size was moderately dilated. Pericardium: There is no evidence of pericardial effusion. Mitral Valve: The mitral valve is normal in structure. Mild mitral valve regurgitation. No evidence of mitral valve stenosis. Tricuspid Valve: The tricuspid valve is normal in structure. Tricuspid valve regurgitation is mild . No evidence of tricuspid stenosis. Aortic Valve: The aortic valve is tricuspid. Aortic valve regurgitation is not visualized. No aortic stenosis is present. Aortic valve peak gradient measures 21.7 mmHg. Pulmonic Valve: The pulmonic valve was normal in structure. Pulmonic valve regurgitation is trivial. No evidence of pulmonic stenosis. Aorta: Aortic dilatation noted. There is borderline dilatation of the aortic root, measuring 38 mm. There is borderline dilatation of the ascending aorta, measuring 38 mm. Venous: The inferior vena cava is dilated in size with less than 50% respiratory variability, suggesting right atrial  pressure of 15 mmHg. IAS/Shunts: No atrial level shunt detected by color flow Doppler.  LEFT VENTRICLE PLAX 2D LVIDd:         4.70 cm      Diastology LVIDs:         3.30 cm      LV e' medial:    3.59 cm/s LV PW:         1.60 cm      LV E/e' medial:  29.2 LV IVS:        1.30 cm      LV e' lateral:   9.36  cm/s LVOT diam:     2.40 cm      LV E/e' lateral: 11.2 LV SV:         56 LV SV Index:   25           2D Longitudinal Strain LVOT Area:     4.52 cm     2D Strain GLS Avg:     -17.9 %  LV Volumes (MOD) LV vol d, MOD A2C: 94.4 ml LV vol d, MOD A4C: 139.0 ml LV vol s, MOD A2C: 57.3 ml LV vol s, MOD A4C: 49.6 ml LV SV MOD A2C:     37.1 ml LV SV MOD A4C:     139.0 ml LV SV MOD BP:      64.2 ml RIGHT VENTRICLE             IVC RV S prime:     10.13 cm/s  IVC diam: 2.60 cm RVOT diam:      3.20 cm TAPSE (M-mode): 2.3 cm LEFT ATRIUM           Index        RIGHT ATRIUM           Index LA diam:      5.10 cm 2.26 cm/m   RA Area:     36.70 cm LA Vol (A2C): 32.2 ml 14.26 ml/m  RA Volume:   138.00 ml 61.12 ml/m LA Vol (A4C): 75.9 ml 33.61 ml/m  AORTIC VALVE                 PULMONIC VALVE AV Area (Vmax): 1.38 cm     PR End Diast Vel: 2.10 msec AV Vmax:        233.00 cm/s AV Peak Grad:   21.7 mmHg LVOT Vmax:      70.90 cm/s LVOT Vmean:     45.850 cm/s LVOT VTI:       0.124 m  AORTA Ao Root diam: 3.80 cm Ao Asc diam:  3.80 cm MITRAL VALVE                TRICUSPID VALVE MV Area (PHT): 3.31 cm     TR Peak grad:   28.3 mmHg MV Decel Time: 229 msec     TR Vmax:        266.00 cm/s MV E velocity: 105.00 cm/s                             SHUNTS                             Systemic VTI:  0.12 m  Systemic Diam: 2.40 cm                             Pulmonic Diam: 3.20 cm Toribio Fuel MD Electronically signed by Toribio Fuel MD Signature Date/Time: 02/21/2024/7:56:09 PM    Final    DG Chest 2 View Result Date: 02/20/2024 CLINICAL DATA:  Shortness of breath past few months. EXAM: CHEST - 2 VIEW COMPARISON:  12/01/2017 FINDINGS: Lungs are adequately inflated and otherwise clear. Cardiomediastinal silhouette and remainder of the exam is unchanged. IMPRESSION: No active cardiopulmonary disease. Electronically Signed   By: Toribio Agreste M.D.   On: 02/20/2024 11:20    Labs: BNP (last 3 results) No results  for input(s): BNP in the last 8760 hours. Basic Metabolic Panel: Recent Labs  Lab 02/20/24 1128 02/20/24 1610 02/21/24 0421 02/22/24 0217  NA 141  --  141 140  K 3.4*  --  3.2* 3.6  CL 105  --  104 104  CO2 25  --  25 25  GLUCOSE 109*  --  108* 108*  BUN 19  --  19 18  CREATININE 0.91  --  0.95 1.00  CALCIUM  9.1  --  8.5* 9.2  MG  --  2.2 2.2  --    Liver Function Tests: No results for input(s): AST, ALT, ALKPHOS, BILITOT, PROT, ALBUMIN in the last 168 hours. No results for input(s): LIPASE, AMYLASE in the last 168 hours. No results for input(s): AMMONIA in the last 168 hours. CBC: Recent Labs  Lab 02/20/24 1128 02/21/24 0421 02/22/24 0217  WBC 6.4 6.8 6.3  HGB 14.8 14.4 14.8  HCT 44.9 43.5 44.2  MCV 91.3 90.8 90.8  PLT 173 172 182   CBG: Recent Labs  Lab 02/20/24 1419  GLUCAP 88   Hgb A1c Recent Labs    02/21/24 0421  HGBA1C 6.0*  Thyroid  function studies Recent Labs    02/20/24 1610  TSH 0.316*  Urinalysis No results found for: COLORURINE, APPEARANCEUR, LABSPEC, PHURINE, GLUCOSEU, HGBUR, BILIRUBINUR, KETONESUR, PROTEINUR, UROBILINOGEN, NITRITE, LEUKOCYTESUR Sepsis Labs Recent Labs  Lab 02/20/24 1128 02/21/24 0421 02/22/24 0217  WBC 6.4 6.8 6.3   Microbiology No results found for this or any previous visit (from the past 240 hours).  Time coordinating discharge: 35  minutes  SIGNED: Mennie LAMY, MD  [facility] 02/22/2024, 10:40 AM  If 7PM-7AM, please contact night-coverage [email or web address]       [1] No Known Allergies

## 2024-02-22 NOTE — Progress Notes (Signed)
 PHARMACY - ANTICOAGULATION CONSULT NOTE  Pharmacy Consult for heparin  Indication: atrial fibrillation  Allergies[1]  Patient Measurements: Height: 5' 9 (175.3 cm) Weight: 111.8 kg (246 lb 8 oz) IBW/kg (Calculated) : 70.7 HEPARIN  DW (KG): 95.4  Vital Signs: Temp: 98.4 F (36.9 C) (01/08 0209) Temp Source: Oral (01/08 0209) BP: 143/95 (01/08 0209) Pulse Rate: 84 (01/08 0209)  Labs: Recent Labs    02/20/24 1128 02/21/24 0421 02/21/24 1738 02/22/24 0217  HGB 14.8 14.4  --  14.8  HCT 44.9 43.5  --  44.2  PLT 173 172  --  182  HEPARINUNFRC  --   --  0.23* 0.38  CREATININE 0.91 0.95  --  1.00    Estimated Creatinine Clearance: 88.3 mL/min (by C-G formula based on SCr of 1 mg/dL).   Medical History: Past Medical History:  Diagnosis Date   AR (allergic rhinitis)    EtOH dependence (HCC) 12/25/2020   Whiskey 6 oz each day and more on the weekends.    Hyperaldosteronism    Hyperglycemia 01/2014   A1c 6.0   Hyperlipidemia    Hypertension    Difficult to control - ? Secondary to Hyperaldosteronism.   Hypokalemia    Rectal bleed 2001   Thyroid  nodule 01/2014     Assessment: 69 year old male presented with shortness of breath concerning for acute exacerbation of heart failure. Noted history of hypertension with presenting BP 163/107, pt reports compliance to PTA regimen although no fills noted for past year. Patient noted to also have new onset atrial fibrillation. He reports taking aspirin  81 mg daily. Pharmacy consulted for management of heparin  infusion.  CHA2DS2VASc score 3.  CBC stable. Enoxaparin  for VTE ppx given 1/6 pm. 02/22/2024 Heparin  level 0.38 therapeutic on 1550 units/hr CBC WNL No bleeding noted  Goal of Therapy:  Heparin  level 0.3-0.7 units/ml Monitor platelets by anticoagulation protocol: Yes   Plan:  -continue heparin  drip at 1550 units/hr -Recheck heparin  level in 6 hours -Daily CBC and heparin  level -Continue to follow for long term  anticoagulation plan   Leeroy Mace RPh 02/22/2024, 2:56 AM         [1] No Known Allergies

## 2024-02-22 NOTE — Progress Notes (Addendum)
 "  As below, patient seen and examined.  He has mild dyspnea on exertion but much improved compared to admission.  He denies chest pain.  Echocardiogram shows normal LV function and right side enlargement.  Patient remains in atrial fibrillation this morning.  His heart rate is controlled on no medications.  Will change to apixaban  5 mg twice daily.  Will have him follow-up in the office in 2 to 4 weeks.  If atrial fibrillation persists would favor cardioversion 21 days after he has been on uninterrupted therapeutic anticoagulation.  Note he snores and will arrange outpatient sleep study as well.  I instructed him on the importance of avoiding alcohol.  His volume status has improved.  Will plan to discharge on Lasix  20 mg daily, spironolactone  25 mg daily and Farxiga  10 mg daily.  Check potassium and renal function in 1 week.  Will also add Avapro  150 mg daily for better blood pressure control.  Patient can be discharged and we will arrange follow-up with APP in 2 to 4 weeks and Dr. Anner in 3 months.  Redell Shallow, MD   Progress Note  Patient Name: Larry [name] Date of Encounter: 02/22/2024 [facility] Cardiologist: Alm Anner, MD   Interval Summary   Reports feeling well, just tired from not sleeping  Denies any chest pain, SOB, lightheadedness, dizziness, palpitations this AM Reports significant urine output with IV Lasix   Vital Signs Vitals:   02/22/24 0209 02/22/24 0209 02/22/24 0500 02/22/24 0621  BP:  (!) 143/95  (!) 141/93  Pulse:  84  (!) 59  Resp: 18 18  18   Temp:  98.4 F (36.9 C)  98.4 F (36.9 C)  TempSrc:  Oral  Oral  SpO2:  97%  98%  Weight:   112 kg   Height:        Intake/Output Summary (Last 24 hours) at 02/22/2024 0850 Last data filed at 02/22/2024 0842 Gross per 24 hour  Intake 1552.46 ml  Output 1250 ml  Net 302.46 ml      02/22/2024    5:00 AM 02/21/2024    4:45 AM 02/20/2024    8:34 PM  Last 3 Weights  Weight (lbs) 246 lb 14.6 oz 246 lb 8  oz 246 lb 8 oz  Weight (kg) 112 kg 111.812 kg 111.812 kg     Telemetry/ECG  Rate controlled A-Fib, rates 60s, frequent pauses, some episodes of bradycardia - Personally Reviewed  Physical Exam  GEN: No acute distress.   Neck: No JVD Cardiac: iRRR, no murmurs, rubs, or gallops.  Respiratory: Clear to auscultation bilaterally. GI: Soft, nontender, non-distended  MS: No edema  Assessment & Plan   Uncontrolled hypertension Previous home meds: lisinopril  10 mg daily, carvedilol  6.25 mg BID, spironolactone  25 mg daily, amlodipine  10 mg daily Dispense report shows no recent fill history of any antihypertensive therapy Presented with BP 163/107, improving  Currently on IV Lasix  40 mg daily Continue spironolactone  25 mg daily Consider adding on low-dose ARB today    Acute HFpEF Home medications: lisinopril  10 mg daily, carvedilol  6.25 mg BID, spironolactone  25 mg daily Dispense report shows no recent fill history of any these medications Echo from 03/2013 showed LVEF 55 to 60%, G1 DD proBNP mildly elevated to 752 CXR clear Suspect could develop reduced LV function in the setting of uncontrolled hypertension Renal function normal Not significantly volume overloaded on exam Reports significant UOP, I&O's may be inaccurate  Echo: LVEF 60-65%, moderate LVH, moderately reduced RV systolic  function, PASP 43.3 mmHg, BAE, mild MR, dilated IVC Currently on IV Lasix  40 mg daily,reports good response, do not suspect he needs additional diuresis IV at this time Continue spironolactone  25 mg daily Continue Farxiga  10 mg daily  Consider adding on low-dose ARB today or adjust/titrate at follow up  Previously on Toprol , held due to pauses/bradycardia on telemetry, may be able to add back in future  Continue strict I's and O's, daily weights, daily BMPs   A-Fib, new onset  No prior documented history of A-Fib EKG showed rate controlled A-Fib with rates 80s Remains in rate controlled A-Fib, denies  any symptoms Previously on Toprol , held due to pauses/bradycardia on telemetry, may be able to add back in future  Currently on IV heparin , transition to DOAC today Discussed TEE/DCCV vs 4 weeks AC then DCCV ? patient would prefer to wait, take four weeks of his OAC, and then consider DCCV, I think this is reasonable and also gives us  a chance to ensure compliance with medications/coming to follow up prior to undergoing DCCV    Nonobstructive CAD per CCTA 2019 Hyperlipidemia  LDL 47 as of 03/2023  CCTA: Calcium  score 38.6, 54th percentile for age/gender, mild nonobstructive CAD Home meds: Crestor  40 mg daily, ASA 81 mg daily  Patient denies any prior or current chest pain Troponin mildly elevated and flat 36 ? 36  EKG does not show any signs of acute ischemia Continue Crestor  40 mg daily No longer on ASA with anticipated DOAC use    Per primary Electrolyte disturbances Hyperaldosteronism GERD Prediabetes   For questions or updates, please contact [facility] Please consult [email or web address] for contact info under       Signed, Waddell DELENA Donath, PA-C   "

## 2024-03-01 ENCOUNTER — Other Ambulatory Visit: Payer: Self-pay | Admitting: Cardiology

## 2024-03-01 DIAGNOSIS — R0683 Snoring: Secondary | ICD-10-CM

## 2024-03-02 NOTE — Progress Notes (Signed)
 Ordering outpatient sleep study per request of MD

## 2024-03-04 NOTE — Progress Notes (Unsigned)
 " Cardiology Office Note   Date:  03/13/2024  ID:  Larry Mckeehan., DOB 12-26-1955, MRN [id number] PCP: Sun, Vyvyan, MD  [facility] Providers Cardiologist:  Alm Clay, MD    History of Present Illness Larry [name]. is a 69 y.o. male with a past medical history of HTN, atrial fibrillation, CHF, nonobstructive CAD, ongoing tobacco use, alcohol dependency, HLD, hyperaldosteronism, history of VT. Followed by Dr. Clay and presents today for a hospital follow up   Patient previously had an echocardiogram in 2015 that showed EF 55-60%, no regional wall motion abnormalities, grade I DD. Coronary CTA from 11/2017 showed a coronary calcium  score of 38.6 (54th percentile), nonobstructive mild CAD.   Patient recently admitted 1/6-02/22/24 with CHF. Presented complaining of dyspnea that had been progressively worsening over the last 4 months. EKG showed new atrial fibrillation with HR 83 BPM. Echocardiogram 02/21/24 showed EF 60-65%, no regional wall motion abnormalities, moderately reduced RV systolic function, mildly elevated PA systolic pressure, mild MR. Patient treated with IV lasix . Cardiology consulted. Discharged on eliquis , lasix  20 mg daily, spironolactone  25 mg daily, farxiga  10 mg daily, avapro  150 mg daily. At the time of DC, patient remained in atrial fibrillation with  HR well controlled. Recommended outpatient eval for possible cardioversion   Today, patient presents for a hospital follow up appointment. Tells me that he has overall been doing well from a cardiac perspective. Denies chest pain, palpitations. Despite being stopped during his recent admission, he has continued to take hydralazine , carvedilol , and amlodipine . BP today 102/62. He reports rare instances of dizziness. No syncope or near syncope. He believes that he has missed 1 dose of his eliquis  since his admission. Has notices occasional red blood on toilet paper after he has a bowel movement, no bleeding  otherwise. He has not smoked in 5-6 months, has not had any alcohol in 5-6 weeks.    Studies Reviewed EKG Interpretation Date/Time:  Wednesday March 13 2024 11:12:18 EST Ventricular Rate:  89 PR Interval:    QRS Duration:  124 QT Interval:  414 QTC Calculation: 503 R Axis:   -72  Text Interpretation: Atrial fibrillation Right bundle branch block Left anterior fascicular block Bifascicular block When compared with ECG of 20-Feb-2024 10:27, PREVIOUS ECG IS PRESENT Confirmed by Vicci Sauer [phone]) on 03/13/2024 12:16:20 PM    Cardiac Studies & Procedures   ______________________________________________________________________________________________   STRESS TESTS  NM MYOCAR MULTI W/SPECT W 01/29/2014  Narrative CLINICAL DATA:  69 year old with atypical chest pain, upper chest, nausea, trouble swallowing on morning of observation.  EXAM: MYOCARDIAL IMAGING WITH SPECT (REST AND PHARMACOLOGIC-STRESS)  GATED LEFT VENTRICULAR WALL MOTION STUDY  LEFT VENTRICULAR EJECTION FRACTION  TECHNIQUE: Standard myocardial SPECT imaging was performed after resting intravenous injection of 10 mCi Tc-61m sestamibi. Subsequently, intravenous infusion of Lexiscan  was performed under the supervision of the Cardiology staff. At peak effect of the drug, 30 mCi Tc-82m sestamibi was injected intravenously and standard myocardial SPECT imaging was performed. Quantitative gated imaging was also performed to evaluate left ventricular wall motion, and estimate left ventricular ejection fraction.  COMPARISON:  None.  FINDINGS: Perfusion: No decreased activity in the left ventricle on stress imaging to suggest reversible ischemia or infarction. Raw images at rest demonstrate generous amount of thyroid  uptake.  Wall Motion: Normal left ventricular wall motion. No left ventricular dilation.  Left Ventricular Ejection Fraction: 50 %  End diastolic volume 162 ml  End systolic volume 82  ml  IMPRESSION: 1. No  reversible ischemia or infarction. There is generous radiotracer (sestamibi) uptake in bilateral thyroid  region (this may represent normal thyroid  uptake). Physical exam of neck recommended. If abnormal, consider thyroid  ultrasound.  2. Normal left ventricular wall motion.  3. Left ventricular ejection fraction 50%  4. Low-risk stress test findings*.  *2012 Appropriate Use Criteria for Coronary Revascularization Focused Update: J Am Coll Cardiol. 2012;59(9):857-881. http://content.dementiazones.com.aspx?articleid=1201161   Electronically Signed By: Oneil Parchment On: 01/29/2014 13:25   ECHOCARDIOGRAM  ECHOCARDIOGRAM COMPLETE 02/21/2024  Narrative ECHOCARDIOGRAM REPORT    Patient Name:   Larry [name] Date of Exam: 02/21/2024 Medical Rec #:  [id number]      Height:       69.0 in Accession #:    [id number]     Weight:       246.5 lb Date of Birth:  06/07/1955     BSA:          2.258 m Patient Age:    68 years       BP:           133/97 mmHg Patient Gender: M              HR:           76 bpm. Exam Location:  Inpatient  Procedure: 2D Echo, Cardiac Doppler, Color Doppler and Strain Analysis (Both Spectral and Color Flow Doppler were utilized during procedure).  Indications:    R06.02 SOB  History:        Patient has prior history of Echocardiogram examinations, most recent 01/29/2014. CHF, Abnormal ECG, Arrythmias:Atrial Fibrillation and VT, Signs/Symptoms:Dizziness/Lightheadedness; Risk Factors:Hypertension and Current Smoker. ETOH.  Sonographer:    Ellouise Mose RDCS Referring Phys: [id number] CLARETTA HERO [facility]   Sonographer Comments: Technically difficult study due to poor echo windows and patient is obese. Image acquisition challenging due to patient body habitus. IMPRESSIONS   1. Left ventricular ejection fraction, by estimation, is 60 to 65%. The left ventricle has normal function. The left ventricle has no regional wall motion  abnormalities. The left ventricular internal cavity size was mildly dilated. There is moderate concentric left ventricular hypertrophy. Left ventricular diastolic parameters are indeterminate. The average left ventricular global longitudinal strain is -17.9 %. The global longitudinal strain is normal. 2. Right ventricular systolic function is moderately reduced. The right ventricular size is moderately enlarged. There is mildly elevated pulmonary artery systolic pressure. The estimated right ventricular systolic pressure is 43.3 mmHg. 3. Left atrial size was moderately dilated. 4. Right atrial size was moderately dilated. 5. The mitral valve is normal in structure. Mild mitral valve regurgitation. No evidence of mitral stenosis. 6. The aortic valve is tricuspid. Aortic valve regurgitation is not visualized. No aortic stenosis is present. 7. Aortic dilatation noted. There is borderline dilatation of the aortic root, measuring 38 mm. There is borderline dilatation of the ascending aorta, measuring 38 mm. 8. The inferior vena cava is dilated in size with <50% respiratory variability, suggesting right atrial pressure of 15 mmHg.  FINDINGS Left Ventricle: Left ventricular ejection fraction, by estimation, is 60 to 65%. The left ventricle has normal function. The left ventricle has no regional wall motion abnormalities. The average left ventricular global longitudinal strain is -17.9 %. Strain was performed and the global longitudinal strain is normal. The left ventricular internal cavity size was mildly dilated. There is moderate concentric left ventricular hypertrophy. Left ventricular diastolic parameters are indeterminate.  Right Ventricle: The right ventricular size is moderately enlarged. No increase in right ventricular wall  thickness. Right ventricular systolic function is moderately reduced. There is mildly elevated pulmonary artery systolic pressure. The tricuspid regurgitant velocity is 2.66 m/s,  and with an assumed right atrial pressure of 15 mmHg, the estimated right ventricular systolic pressure is 43.3 mmHg.  Left Atrium: Left atrial size was moderately dilated.  Right Atrium: Right atrial size was moderately dilated.  Pericardium: There is no evidence of pericardial effusion.  Mitral Valve: The mitral valve is normal in structure. Mild mitral valve regurgitation. No evidence of mitral valve stenosis.  Tricuspid Valve: The tricuspid valve is normal in structure. Tricuspid valve regurgitation is mild . No evidence of tricuspid stenosis.  Aortic Valve: The aortic valve is tricuspid. Aortic valve regurgitation is not visualized. No aortic stenosis is present. Aortic valve peak gradient measures 21.7 mmHg.  Pulmonic Valve: The pulmonic valve was normal in structure. Pulmonic valve regurgitation is trivial. No evidence of pulmonic stenosis.  Aorta: Aortic dilatation noted. There is borderline dilatation of the aortic root, measuring 38 mm. There is borderline dilatation of the ascending aorta, measuring 38 mm.  Venous: The inferior vena cava is dilated in size with less than 50% respiratory variability, suggesting right atrial pressure of 15 mmHg.  IAS/Shunts: No atrial level shunt detected by color flow Doppler.   LEFT VENTRICLE PLAX 2D LVIDd:         4.70 cm      Diastology LVIDs:         3.30 cm      LV e' medial:    3.59 cm/s LV PW:         1.60 cm      LV E/e' medial:  29.2 LV IVS:        1.30 cm      LV e' lateral:   9.36 cm/s LVOT diam:     2.40 cm      LV E/e' lateral: 11.2 LV SV:         56 LV SV Index:   25           2D Longitudinal Strain LVOT Area:     4.52 cm     2D Strain GLS Avg:     -17.9 %  LV Volumes (MOD) LV vol d, MOD A2C: 94.4 ml LV vol d, MOD A4C: 139.0 ml LV vol s, MOD A2C: 57.3 ml LV vol s, MOD A4C: 49.6 ml LV SV MOD A2C:     37.1 ml LV SV MOD A4C:     139.0 ml LV SV MOD BP:      64.2 ml  RIGHT VENTRICLE             IVC RV S prime:     10.13  cm/s  IVC diam: 2.60 cm RVOT diam:      3.20 cm TAPSE (M-mode): 2.3 cm  LEFT ATRIUM           Index        RIGHT ATRIUM           Index LA diam:      5.10 cm 2.26 cm/m   RA Area:     36.70 cm LA Vol (A2C): 32.2 ml 14.26 ml/m  RA Volume:   138.00 ml 61.12 ml/m LA Vol (A4C): 75.9 ml 33.61 ml/m AORTIC VALVE                 PULMONIC VALVE AV Area (Vmax): 1.38 cm     PR End Diast Vel: 2.10 msec AV  Vmax:        233.00 cm/s AV Peak Grad:   21.7 mmHg LVOT Vmax:      70.90 cm/s LVOT Vmean:     45.850 cm/s LVOT VTI:       0.124 m  AORTA Ao Root diam: 3.80 cm Ao Asc diam:  3.80 cm  MITRAL VALVE                TRICUSPID VALVE MV Area (PHT): 3.31 cm     TR Peak grad:   28.3 mmHg MV Decel Time: 229 msec     TR Vmax:        266.00 cm/s MV E velocity: 105.00 cm/s SHUNTS Systemic VTI:  0.12 m Systemic Diam: 2.40 cm Pulmonic Diam: 3.20 cm  Toribio Fuel MD Electronically signed by Toribio Fuel MD Signature Date/Time: 02/21/2024/7:56:09 PM    Final      CT SCANS  CT CORONARY MORPH W/CTA COR W/SCORE 12/01/2017  Addendum 12/02/2017  4:15 PM ADDENDUM REPORT: 12/02/2017 16:12  EXAM: OVER-READ INTERPRETATION  CT CHEST  The following report is an over-read performed by radiologist Dr. Selinda Saas [facility] on 12/02/2017. This over-read does not include interpretation of cardiac or coronary anatomy or pathology. The coronary CTA interpretation by the cardiologist is attached.  COMPARISON:  Chest radiograph from earlier today.  FINDINGS: Cardiovascular: No significant pericardial effusion/thickening. Atherosclerotic thoracic aorta with ectatic 4.2 cm ascending thoracic aorta. Top-normal main pulmonary artery (3.3 cm diameter). No central pulmonary emboli.  Mediastinum/Nodes: Unremarkable esophagus. No pathologically enlarged mediastinal or hilar lymph nodes.  Lungs/Pleura: No pneumothorax. No pleural effusion. No acute consolidative airspace disease  or lung masses. Right middle lobe 4 mm solid pulmonary nodule (series 12/image 35).  Upper abdomen: No acute abnormality.  Musculoskeletal: No aggressive appearing focal osseous lesions. Symmetric mild gynecomastia. Mild thoracic spondylosis.  IMPRESSION: 1. Right middle lobe 4 mm solid pulmonary nodule. No follow-up needed if patient is low-risk. Non-contrast chest CT can be considered in 12 months if patient is high-risk. This recommendation follows the consensus statement: Guidelines for Management of Incidental Pulmonary Nodules Detected on CT Images:From the Fleischner Society 2017; published online before print (10.1148/radiol.[id number]). 2. Ectatic 4.2 cm ascending thoracic aorta. Recommend annual imaging followup by CTA or MRA. This recommendation follows 2010 ACCF/AHA/AATS/ACR/ASA/SCA/SCAI/SIR/STS/SVM Guidelines for the Diagnosis and Management of Patients with Thoracic Aortic Disease. Circulation. 2010; 121: z733-z630. 3.  Aortic Atherosclerosis (ICD10-I70.0).   Electronically Signed By: Selinda DELENA Blue M.D. On: 12/02/2017 16:12  Narrative CLINICAL DATA:  Chest pain  EXAM: Cardiac CTA  MEDICATIONS: Sub lingual nitro. 4mg  x 2  TECHNIQUE: The patient was scanned on a Siemens 192 slice scanner. Gantry rotation speed was 250 msecs. Collimation was 0.6 mm. A 100 kV prospective scan was triggered in the ascending thoracic aorta at 35-75% of the R-R interval. Average HR during the scan was 60 bpm. The 3D data set was interpreted on a dedicated work station using MPR, MIP and VRT modes. A total of 80cc of contrast was used.  FINDINGS: Non-cardiac: See separate report from [facility].  Pulmonary veins drain normally to the left atrium.  Calcium  Score: 38.6 Agatston units.  Coronary Arteries: Right dominant with no anomalies  LM: No plaque or stenosis.  LAD system: No plaque or stenosis in LAD. There is a large D1 with mixed plaque, mild stenosis  proximally.  Circumflex system: No plaque or stenosis.  RCA system: Calcified plaque proximal RCA, mild stenosis.  IMPRESSION: 1. Coronary artery calcium   score 38.6 Agatston units. This places the patient in the 54th percentile for age and gender, suggesting intermediate risk for future cardiac events.  2.  Nonobstructive mild CAD.  [employer]  Electronically Signed: By: Ezra Shuck M.D. On: 12/02/2017 07:26     ______________________________________________________________________________________________       Risk Assessment/Calculations  CHA2DS2-VASc Score = 3   This indicates a 3.2% annual risk of stroke. The patient's score is based upon: CHF History: 1 HTN History: 1 Diabetes History: 0 Stroke History: 0 Vascular Disease History: 0 Age Score: 1 Gender Score: 0   STOP-Bang Score:  8      Physical Exam VS:  BP 109/67   Pulse 89   Ht 5' 10 (1.778 m)   Wt 259 lb (117.5 kg)   SpO2 97%   BMI 37.16 kg/m        Wt Readings from Last 3 Encounters:  03/13/24 259 lb (117.5 kg)  02/22/24 246 lb 14.6 oz (112 kg)  12/25/20 242 lb 3.2 oz (109.9 kg)    GEN: Well nourished, well developed in no acute distress. Sitting comfortably on the exam table  NECK: No JVD  CARDIAC:  Irregular rate and rhythm, no murmurs, rubs, gallops RESPIRATORY:  Clear to auscultation without rales, wheezing or rhonchi. Normal WOB on room air  ABDOMEN: Soft, non-tender, non-distended EXTREMITIES:  No edema in BLE; No deformity   ASSESSMENT AND PLAN  Persistent atrial fibrillation  - Diagnosed during recent admission. Was rate controlled without use of AV nodal medications. TSH low, but free T4 normal. Mag normal. K low on presentation, but was supplemented during admission  - EKG today shows atrial fibrillation with HR 89 BPM, RBBB, LAFB  - Denies palpitations, chest pain, SOB  - Patient has continued to take carvedilol  6.25 mg BID since his admission. Denies syncope, bradycardia  at home. OK to continue for HR control  - Continue eliqus 5 mg BID  - Ordered CBC and BMP  - Patient believes he has missed at least 1 dose of eliquis  since DC. We reviewed importance of compliance with DOAC. Arranged 3 week follow up to set up DCCV  - Patient tells me he has stopped drinking alcohol. Congratulated him on this progress. Setting him up for sleep study   Chronic HFpEF  RV dysfunction  Mild MR  HTN  - Echo during recent admission showed EF 60-65%, no regional wall motion abnormalities, moderately reduced RV systolic function, mildly elevated PA systolic pressure, mild MR - Denies chest pain, DOE, ankle edema.  - Despite being stopped during recent admission, patient has continued to take carvedilol  6.25 mg BID, amlodipine  10 mg daily, hydralazine  25 mg TID  - BP low-normal - Stop hydralazine . Decrease amlodipine  to 5 mg daily. - Continue farxiga  10 mg daily, irbesartan  150 mg daily, spironolactone  25 mg daily, carvedilol  6.25 mg BID  - Continue lasix  20 mg daily - he is euvolemic on exam today  - Ordered BMP  - With RV enlargement and new afib, recommended sleep study. Patient agreeable. STOP-Bang 8. Referred to pulm  for overnight study   Nonobstructive CAD  HLD  - Coronary CTA from 11/2017 showed a coronary calcium  score of 38.6 (54th percentile), nonobstructive mild CAD - No anginal symptoms  - Continue crestor  40 mg daily - LDL 47 in 03/2023   Tobacco Use  Alcohol dependency  - Patient tells me that he has not smoked in 5-6 months, has not had alcohol in 5 weeks. Congratulated  him on these lifestyle changes    Dispo: Follow up with an APP in 3 weeks to review eliquis  compliance, set up DCCV   Signed, Rollo FABIENE Louder, PA-C   "

## 2024-03-13 ENCOUNTER — Ambulatory Visit: Attending: Cardiology | Admitting: Cardiology

## 2024-03-13 ENCOUNTER — Encounter: Payer: Self-pay | Admitting: Cardiology

## 2024-03-13 VITALS — BP 109/67 | HR 89 | Ht 70.0 in | Wt 259.0 lb

## 2024-03-13 DIAGNOSIS — G4730 Sleep apnea, unspecified: Secondary | ICD-10-CM

## 2024-03-13 DIAGNOSIS — I340 Nonrheumatic mitral (valve) insufficiency: Secondary | ICD-10-CM

## 2024-03-13 DIAGNOSIS — I5032 Chronic diastolic (congestive) heart failure: Secondary | ICD-10-CM

## 2024-03-13 DIAGNOSIS — I4891 Unspecified atrial fibrillation: Secondary | ICD-10-CM | POA: Diagnosis not present

## 2024-03-13 DIAGNOSIS — F1021 Alcohol dependence, in remission: Secondary | ICD-10-CM | POA: Diagnosis not present

## 2024-03-13 DIAGNOSIS — I10 Essential (primary) hypertension: Secondary | ICD-10-CM | POA: Diagnosis not present

## 2024-03-13 DIAGNOSIS — I2510 Atherosclerotic heart disease of native coronary artery without angina pectoris: Secondary | ICD-10-CM | POA: Diagnosis not present

## 2024-03-13 MED ORDER — AMLODIPINE BESYLATE 5 MG PO TABS: 5.0000 mg | ORAL_TABLET | Freq: Every day | ORAL | 3 refills | Status: AC

## 2024-03-13 MED ORDER — CARVEDILOL 6.25 MG PO TABS: 6.2500 mg | ORAL_TABLET | Freq: Two times a day (BID) | ORAL | 3 refills | Status: AC

## 2024-03-13 NOTE — Patient Instructions (Addendum)
 Medication Instructions:  Your physician has recommended you make the following change in your medication:   CONTINUE THE COREG  6.25 TAKING 1 TWICE A DAY  REDUCE the AMLODIPINE  TO 5 MG TAKING 1 DAILY  STOP THE HYDRALAZINE   *If you need a refill on your cardiac medications before your next appointment, please call your pharmacy*  Lab Work: TODAY:  BMET & CBC  If you have labs (blood work) drawn today and your tests are completely normal, you will receive your results only by: MyChart Message (if you have MyChart) OR A paper copy in the mail If you have any lab test that is abnormal or we need to change your treatment, we will call you to review the results.  Testing/Procedures: None ordered  Follow-Up: At [facility], you and your health needs are our priority.  As part of our continuing mission to provide you with exceptional heart care, our providers are all part of one team.  This team includes your primary Cardiologist (physician) and Advanced Practice Providers or APPs (Physician Assistants and Nurse Practitioners) who all work together to provide you with the care you need, when you need it.  Your next appointment:   3 week(s)  Provider:   Lum Louis, NP    We recommend signing up for the patient portal called MyChart.  Sign up information is provided on this After Visit Summary.  MyChart is used to connect with patients for Virtual Visits (Telemedicine).  Patients are able to view lab/test results, encounter notes, upcoming appointments, etc.  Non-urgent messages can be sent to your provider as well.   To learn more about what you can do with MyChart, go to [email or web address].   Other Instructions

## 2024-03-14 ENCOUNTER — Ambulatory Visit: Payer: Self-pay | Admitting: Cardiology

## 2024-03-14 LAB — CBC
Hematocrit: 44.7 % (ref 37.5–51.0)
Hemoglobin: 14.8 g/dL (ref 13.0–17.7)
MCH: 29.7 pg (ref 26.6–33.0)
MCHC: 33.1 g/dL (ref 31.5–35.7)
MCV: 90 fL (ref 79–97)
Platelets: 218 10*3/uL (ref 150–450)
RBC: 4.98 x10E6/uL (ref 4.14–5.80)
RDW: 13 % (ref 11.6–15.4)
WBC: 6.6 10*3/uL (ref 3.4–10.8)

## 2024-03-14 LAB — BASIC METABOLIC PANEL WITH GFR
BUN/Creatinine Ratio: 14 (ref 10–24)
BUN: 18 mg/dL (ref 8–27)
CO2: 26 mmol/L (ref 20–29)
Calcium: 9.4 mg/dL (ref 8.6–10.2)
Chloride: 104 mmol/L (ref 96–106)
Creatinine, Ser: 1.33 mg/dL — AB (ref 0.76–1.27)
Glucose: 81 mg/dL (ref 70–99)
Potassium: 5 mmol/L (ref 3.5–5.2)
Sodium: 145 mmol/L — AB (ref 134–144)
eGFR: 59 mL/min/{1.73_m2} — AB

## 2024-03-22 ENCOUNTER — Ambulatory Visit: Admitting: Physician Assistant

## 2024-04-03 ENCOUNTER — Ambulatory Visit: Admitting: Emergency Medicine
